# Patient Record
Sex: Male | Born: 1935 | Race: Black or African American | Hispanic: No | Marital: Married | Smoking: Former smoker
Health system: Southern US, Community
[De-identification: ages and names within clinical notes are randomized; demographics above are authoritative.]

## PROBLEM LIST (undated history)

## (undated) DIAGNOSIS — I509 Heart failure, unspecified: Secondary | ICD-10-CM

## (undated) DIAGNOSIS — I10 Essential (primary) hypertension: Secondary | ICD-10-CM

## (undated) HISTORY — DX: Essential (primary) hypertension: I10

---

## 2007-03-30 ENCOUNTER — Ambulatory Visit: Payer: Self-pay | Admitting: Internal Medicine

## 2007-07-31 ENCOUNTER — Ambulatory Visit: Payer: Self-pay | Admitting: Gastroenterology

## 2008-10-28 ENCOUNTER — Ambulatory Visit: Payer: Self-pay | Admitting: Gastroenterology

## 2009-01-12 ENCOUNTER — Ambulatory Visit: Payer: Self-pay | Admitting: Gastroenterology

## 2011-11-13 ENCOUNTER — Ambulatory Visit: Payer: Self-pay | Admitting: Internal Medicine

## 2017-09-17 ENCOUNTER — Observation Stay
Admit: 2017-09-17 | Discharge: 2017-09-17 | Disposition: A | Discharge status: Home / Self Care | Payer: Medicare Other | Attending: Internal Medicine | Admitting: Internal Medicine

## 2017-09-17 ENCOUNTER — Encounter: Payer: Self-pay | Admitting: Emergency Medicine

## 2017-09-17 ENCOUNTER — Emergency Department: Payer: Medicare Other

## 2017-09-17 ENCOUNTER — Other Ambulatory Visit: Payer: Self-pay

## 2017-09-17 ENCOUNTER — Inpatient Hospital Stay
Admission: EM | Admit: 2017-09-17 | Discharge: 2017-09-18 | DRG: 292 | Disposition: A | Discharge status: Home / Self Care | Payer: Medicare Other | Attending: Internal Medicine | Admitting: Internal Medicine

## 2017-09-17 DIAGNOSIS — R0602 Shortness of breath: Secondary | ICD-10-CM | POA: Diagnosis not present

## 2017-09-17 DIAGNOSIS — Z87891 Personal history of nicotine dependence: Secondary | ICD-10-CM

## 2017-09-17 DIAGNOSIS — R05 Cough: Secondary | ICD-10-CM

## 2017-09-17 DIAGNOSIS — I340 Nonrheumatic mitral (valve) insufficiency: Secondary | ICD-10-CM | POA: Diagnosis present

## 2017-09-17 DIAGNOSIS — J449 Chronic obstructive pulmonary disease, unspecified: Secondary | ICD-10-CM | POA: Diagnosis present

## 2017-09-17 DIAGNOSIS — I5023 Acute on chronic systolic (congestive) heart failure: Principal | ICD-10-CM | POA: Diagnosis present

## 2017-09-17 DIAGNOSIS — I472 Ventricular tachycardia: Secondary | ICD-10-CM | POA: Diagnosis present

## 2017-09-17 DIAGNOSIS — R059 Cough, unspecified: Secondary | ICD-10-CM

## 2017-09-17 DIAGNOSIS — J189 Pneumonia, unspecified organism: Secondary | ICD-10-CM

## 2017-09-17 DIAGNOSIS — I509 Heart failure, unspecified: Secondary | ICD-10-CM

## 2017-09-17 DIAGNOSIS — Z7982 Long term (current) use of aspirin: Secondary | ICD-10-CM

## 2017-09-17 DIAGNOSIS — Z79899 Other long term (current) drug therapy: Secondary | ICD-10-CM

## 2017-09-17 HISTORY — DX: Heart failure, unspecified: I50.9

## 2017-09-17 LAB — ECHOCARDIOGRAM COMPLETE
Height: 74 in
Weight: 2320 oz

## 2017-09-17 LAB — BRAIN NATRIURETIC PEPTIDE: B Natriuretic Peptide: 1513 pg/mL — ABNORMAL HIGH (ref 0.0–100.0)

## 2017-09-17 LAB — COMPREHENSIVE METABOLIC PANEL
ALT: 15 U/L (ref 0–44)
ANION GAP: 7 (ref 5–15)
AST: 27 U/L (ref 15–41)
Albumin: 3.2 g/dL — ABNORMAL LOW (ref 3.5–5.0)
Alkaline Phosphatase: 83 U/L (ref 38–126)
BUN: 14 mg/dL (ref 8–23)
CO2: 23 mmol/L (ref 22–32)
Calcium: 8.2 mg/dL — ABNORMAL LOW (ref 8.9–10.3)
Chloride: 110 mmol/L (ref 98–111)
Creatinine, Ser: 1.01 mg/dL (ref 0.61–1.24)
Glucose, Bld: 114 mg/dL — ABNORMAL HIGH (ref 70–99)
POTASSIUM: 4.2 mmol/L (ref 3.5–5.1)
Sodium: 140 mmol/L (ref 135–145)
TOTAL PROTEIN: 6.8 g/dL (ref 6.5–8.1)
Total Bilirubin: 1.1 mg/dL (ref 0.3–1.2)

## 2017-09-17 LAB — TROPONIN I
TROPONIN I: 0.06 ng/mL — AB (ref <0.03)
TROPONIN I: 0.07 ng/mL — AB (ref <0.03)
TROPONIN I: 0.07 ng/mL — AB (ref <0.03)
TROPONIN I: 0.08 ng/mL — AB (ref <0.03)

## 2017-09-17 LAB — CBC WITH DIFFERENTIAL/PLATELET
BASOS PCT: 1 %
Basophils Absolute: 0 10*3/uL (ref 0–0.1)
Eosinophils Absolute: 0.2 10*3/uL (ref 0–0.7)
Eosinophils Relative: 5 %
HCT: 36.5 % — ABNORMAL LOW (ref 40.0–52.0)
Hemoglobin: 12 g/dL — ABNORMAL LOW (ref 13.0–18.0)
LYMPHS PCT: 34 %
Lymphs Abs: 1.2 10*3/uL (ref 1.0–3.6)
MCH: 31 pg (ref 26.0–34.0)
MCHC: 33 g/dL (ref 32.0–36.0)
MCV: 94.2 fL (ref 80.0–100.0)
MONO ABS: 0.4 10*3/uL (ref 0.2–1.0)
MONOS PCT: 11 %
Neutro Abs: 1.7 10*3/uL (ref 1.4–6.5)
Neutrophils Relative %: 49 %
Platelets: 172 10*3/uL (ref 150–440)
RBC: 3.88 MIL/uL — ABNORMAL LOW (ref 4.40–5.90)
RDW: 13.2 % (ref 11.5–14.5)
WBC: 3.5 10*3/uL — ABNORMAL LOW (ref 3.8–10.6)

## 2017-09-17 LAB — PROCALCITONIN

## 2017-09-17 MED ORDER — ENOXAPARIN SODIUM 40 MG/0.4ML ~~LOC~~ SOLN
40.0000 mg | SUBCUTANEOUS | Status: DC
  Administered 2017-09-17: 40 mg via SUBCUTANEOUS
  Filled 2017-09-17: qty 0.4

## 2017-09-17 MED ORDER — HYDROXYZINE HCL 10 MG PO TABS
10.0000 mg | ORAL_TABLET | Freq: Every day | ORAL | Status: DC
  Administered 2017-09-17: 10 mg via ORAL
  Filled 2017-09-17 (×2): qty 1

## 2017-09-17 MED ORDER — ACETAMINOPHEN 325 MG PO TABS: 650.0000 mg | ORAL_TABLET | Freq: Four times a day (QID) | ORAL | Status: DC | PRN

## 2017-09-17 MED ORDER — ONDANSETRON HCL 4 MG PO TABS: 4.0000 mg | ORAL_TABLET | Freq: Four times a day (QID) | ORAL | Status: DC | PRN

## 2017-09-17 MED ORDER — FUROSEMIDE 10 MG/ML IJ SOLN: 20.0000 mg | Freq: Two times a day (BID) | INTRAMUSCULAR | Status: DC

## 2017-09-17 MED ORDER — SACUBITRIL-VALSARTAN 24-26 MG PO TABS
1.0000 | ORAL_TABLET | Freq: Two times a day (BID) | ORAL | Status: DC
  Administered 2017-09-18: 1 via ORAL
  Filled 2017-09-17 (×3): qty 1

## 2017-09-17 MED ORDER — ALBUTEROL SULFATE (2.5 MG/3ML) 0.083% IN NEBU
2.5000 mg | INHALATION_SOLUTION | RESPIRATORY_TRACT | Status: DC | PRN
  Administered 2017-09-17: 2.5 mg via RESPIRATORY_TRACT
  Filled 2017-09-17: qty 3

## 2017-09-17 MED ORDER — AZITHROMYCIN 500 MG PO TABS
500.0000 mg | ORAL_TABLET | Freq: Once | ORAL | Status: AC
  Administered 2017-09-17: 500 mg via ORAL
  Filled 2017-09-17: qty 1

## 2017-09-17 MED ORDER — SPIRONOLACTONE 25 MG PO TABS
12.5000 mg | ORAL_TABLET | Freq: Every day | ORAL | Status: DC
  Administered 2017-09-17: 12.5 mg via ORAL
  Filled 2017-09-17: qty 1
  Filled 2017-09-17 (×2): qty 0.5

## 2017-09-17 MED ORDER — CARVEDILOL 3.125 MG PO TABS
3.1250 mg | ORAL_TABLET | Freq: Two times a day (BID) | ORAL | Status: DC
  Administered 2017-09-17 – 2017-09-18 (×3): 3.125 mg via ORAL
  Filled 2017-09-17 (×3): qty 1

## 2017-09-17 MED ORDER — LEVOFLOXACIN 500 MG PO TABS: 500.0000 mg | ORAL_TABLET | Freq: Every day | ORAL | Status: DC

## 2017-09-17 MED ORDER — ACETAMINOPHEN 650 MG RE SUPP: 650.0000 mg | Freq: Four times a day (QID) | RECTAL | Status: DC | PRN

## 2017-09-17 MED ORDER — GUAIFENESIN-DM 100-10 MG/5ML PO SYRP
5.0000 mL | ORAL_SOLUTION | ORAL | Status: DC | PRN
  Administered 2017-09-17 – 2017-09-18 (×2): 5 mL via ORAL
  Filled 2017-09-17 (×2): qty 5

## 2017-09-17 MED ORDER — SODIUM CHLORIDE 0.9 % IV SOLN: INTRAVENOUS | Status: DC

## 2017-09-17 MED ORDER — FUROSEMIDE 10 MG/ML IJ SOLN
20.0000 mg | Freq: Two times a day (BID) | INTRAMUSCULAR | Status: DC
  Administered 2017-09-17: 20 mg via INTRAVENOUS
  Filled 2017-09-17 (×2): qty 4

## 2017-09-17 MED ORDER — SODIUM CHLORIDE 0.9 % IV SOLN
1.0000 g | Freq: Once | INTRAVENOUS | Status: AC
  Administered 2017-09-17: 1 g via INTRAVENOUS
  Filled 2017-09-17: qty 10

## 2017-09-17 MED ORDER — ASPIRIN EC 81 MG PO TBEC
81.0000 mg | DELAYED_RELEASE_TABLET | ORAL | Status: DC
  Administered 2017-09-18: 81 mg via ORAL
  Filled 2017-09-17: qty 1

## 2017-09-17 MED ORDER — ONDANSETRON HCL 4 MG/2ML IJ SOLN: 4.0000 mg | Freq: Four times a day (QID) | INTRAMUSCULAR | Status: DC | PRN

## 2017-09-17 NOTE — Progress Notes (Signed)
Spoke with Dr. Lowella FairyMiaer blood pressure is 115/82 and has 2 blood pressure medications order. Per md pt may have the coreg tonight [facility]

## 2017-09-17 NOTE — ED Notes (Signed)
Pt wife inquiring about pt taking home meds while here. This RN will notified EDP of request.

## 2017-09-17 NOTE — ED Notes (Signed)
.   Pt is resting, Respirations even and unlabored, NAD. Stretcher lowest postion and locked. Call bell within reach. Denies any needs at this time RN will continue to monitor.   Family at bedside.warm blanket given at this time.

## 2017-09-17 NOTE — Consult Note (Signed)
Erik Randall is a 84 y.o. male  [id number]  Primary Cardiologist: Arnoldo Hooker Reason for Consultation: CHF, severe MR and LV dysfunction.  HPI: 84 YOBM came to ER with SOB for few days which he thaught was due to cold. He denies any chest pain.   Review of Systems:  Has orthopnea/PND   Past Medical History:  Diagnosis Date  . CHF (congestive heart failure) (HCC)     Medications Prior to Admission  Medication Sig Dispense Refill  . aspirin EC 81 MG tablet Take 81 mg by mouth 2 (two) times a week.     . carvedilol (COREG) 3.125 MG tablet Take 3.125 mg by mouth 2 (two) times daily.  11  . furosemide (LASIX) 20 MG tablet Take 10 mg by mouth daily.  3  . hydrOXYzine (ATARAX/VISTARIL) 10 MG tablet Take 10 mg by mouth at bedtime.  0  . spironolactone (ALDACTONE) 25 MG tablet Take 12.5 mg by mouth daily.   1  . triamcinolone ointment (KENALOG) 0.1 % Apply 1 application topically at bedtime. (avoid face, groin and armpits)  0     . [START ON 09/18/2017] aspirin EC  81 mg Oral Once per day on Mon Thu  . carvedilol  3.125 mg Oral BID  . enoxaparin (LOVENOX) injection  40 mg Subcutaneous Q24H  . furosemide  20 mg Intravenous Q12H  . hydrOXYzine  10 mg Oral QHS  . sacubitril-valsartan  1 tablet Oral BID  . spironolactone  12.5 mg Oral Daily    Infusions:   No Known Allergies  Social History   Socioeconomic History  . Marital status: Married    Spouse name: Not on file  . Number of children: Not on file  . Years of education: Not on file  . Highest education level: Not on file  Occupational History  . Not on file  Social Needs  . Financial resource strain: Not on file  . Food insecurity:    Worry: Not on file    Inability: Not on file  . Transportation needs:    Medical: Not on file    Non-medical: Not on file  Tobacco Use  . Smoking status: Former Games developer  . Smokeless tobacco: Never Used  Substance and Sexual Activity  . Alcohol use: Never    Frequency: Never   . Drug use: Never  . Sexual activity: Not on file  Lifestyle  . Physical activity:    Days per week: Not on file    Minutes per session: Not on file  . Stress: Not on file  Relationships  . Social connections:    Talks on phone: Not on file    Gets together: Not on file    Attends religious service: Not on file    Active member of club or organization: Not on file    Attends meetings of clubs or organizations: Not on file    Relationship status: Not on file  . Intimate partner violence:    Fear of current or ex partner: Not on file    Emotionally abused: Not on file    Physically abused: Not on file    Forced sexual activity: Not on file  Other Topics Concern  . Not on file  Social History Narrative  . Not on file    History reviewed. No pertinent family history.  PHYSICAL EXAM: Vitals:   09/17/17 0735 09/17/17 1130  BP:  124/86  Pulse:  78  Resp:  18  Temp:  97.8  F (36.6 C)  SpO2: 95% 95%     Intake/Output Summary (Last 24 hours) at 09/17/2017 1445 Last data filed at 09/17/2017 1358 Gross per 24 hour  Intake 240 ml  Output -  Net 240 ml    General:  Well appearing. No respiratory difficulty HEENT: normal Neck: supple. no JVD. Carotids 2+ bilat; no bruits. No lymphadenopathy or thryomegaly appreciated. Cor: PMI nondisplaced. Regular rate & rhythm. No rubs, gallops or murmurs. Lungs: clear Abdomen: soft, nontender, nondistended. No hepatosplenomegaly. No bruits or masses. Good bowel sounds. Extremities: no cyanosis, clubbing, rash, edema Neuro: alert & oriented x 3, cranial nerves grossly intact. moves all 4 extremities w/o difficulty. Affect pleasant.  ECG: NSR frequent PVC, LAE, LVH, non specific st and t changes and Old ASWMI  Results for orders placed or performed during the hospital encounter of 09/17/17 (from the past 24 hour(s))  CBC with Differential     Status: Abnormal   Collection Time: 09/17/17  6:18 AM  Result Value Ref Range   WBC 3.5 (L) 3.8  - 10.6 K/uL   RBC 3.88 (L) 4.40 - 5.90 MIL/uL   Hemoglobin 12.0 (L) 13.0 - 18.0 g/dL   HCT 82.936.5 (L) 56.240.0 - 13.052.0 %   MCV 94.2 80.0 - 100.0 fL   MCH 31.0 26.0 - 34.0 pg   MCHC 33.0 32.0 - 36.0 g/dL   RDW 86.513.2 78.411.5 - 69.614.5 %   Platelets 172 150 - 440 K/uL   Neutrophils Relative % 49 %   Neutro Abs 1.7 1.4 - 6.5 K/uL   Lymphocytes Relative 34 %   Lymphs Abs 1.2 1.0 - 3.6 K/uL   Monocytes Relative 11 %   Monocytes Absolute 0.4 0.2 - 1.0 K/uL   Eosinophils Relative 5 %   Eosinophils Absolute 0.2 0 - 0.7 K/uL   Basophils Relative 1 %   Basophils Absolute 0.0 0 - 0.1 K/uL  Comprehensive metabolic panel     Status: Abnormal   Collection Time: 09/17/17  6:18 AM  Result Value Ref Range   Sodium 140 135 - 145 mmol/L   Potassium 4.2 3.5 - 5.1 mmol/L   Chloride 110 98 - 111 mmol/L   CO2 23 22 - 32 mmol/L   Glucose, Bld 114 (H) 70 - 99 mg/dL   BUN 14 8 - 23 mg/dL   Creatinine, Ser 2.951.01 0.61 - 1.24 mg/dL   Calcium 8.2 (L) 8.9 - 10.3 mg/dL   Total Protein 6.8 6.5 - 8.1 g/dL   Albumin 3.2 (L) 3.5 - 5.0 g/dL   AST 27 15 - 41 U/L   ALT 15 0 - 44 U/L   Alkaline Phosphatase 83 38 - 126 U/L   Total Bilirubin 1.1 0.3 - 1.2 mg/dL   GFR calc non Af Amer >60 >60 mL/min   GFR calc Af Amer >60 >60 mL/min   Anion gap 7 5 - 15  Troponin I     Status: Abnormal   Collection Time: 09/17/17  6:18 AM  Result Value Ref Range   Troponin I 0.06 (HH) <0.03 ng/mL  Brain natriuretic peptide     Status: Abnormal   Collection Time: 09/17/17  6:18 AM  Result Value Ref Range   B Natriuretic Peptide 1,513.0 (H) 0.0 - 100.0 pg/mL  Procalcitonin - Baseline     Status: None   Collection Time: 09/17/17 11:54 AM  Result Value Ref Range   Procalcitonin <0.10 ng/mL  Troponin I     Status: Abnormal   Collection  Time: 09/17/17 11:54 AM  Result Value Ref Range   Troponin I 0.07 (HH) <0.03 ng/mL   Dg Chest 2 View  Result Date: 09/17/2017 CLINICAL DATA:  Dyspnea EXAM: CHEST - 2 VIEW COMPARISON:  11/13/2011 chest  radiograph. FINDINGS: Stable cardiomediastinal silhouette with mild cardiomegaly. No pneumothorax. Trace bilateral pleural effusions. Emphysema with hyperinflated lungs. Mild diffuse prominence of the parahilar interstitial markings. Mild hazy opacities at the lung bases, slightly increased. IMPRESSION: 1. Trace bilateral pleural effusions. 2. Stable mild cardiomegaly with mild diffuse prominence of the parahilar interstitial markings, cannot exclude mild pulmonary edema. 3. Mild hazy opacities at the lung bases, slightly increased, which could represent aspiration or pneumonia. Electronically Signed   By: Delbert Phenix M.D.   On: 09/17/2017 07:04     ASSESSMENT AND PLAN: Severe Mitral regurgitation with severe LV systolic dysfunction with LVEF 10-12 %, and congestive heart failure. Advise Right and left heart cath and evaluation at [facility] for MVR. He wants to be treated medically and will add entaresto.  Donovyn Guidice A

## 2017-09-17 NOTE — Care Management Note (Signed)
Case Management Note  Patient Details  Name: Erik Randall MRN: [id number] Date of Birth: 08-03-1926  Subjective/Objective:  Patient admitted to [facility] under observation status for pneumonia. RNCM consulted on patient to provide MOON letter and complete assessment. Patient lives with his wife Erik Randall [phone]. Patient tells me he is completely independent with his activities if daily living. Requires no DME. Still drives. PCP is [facility]. No issues obtaining medications.                   Action/Plan:  RNCM to continue to follow for any needs.  Expected Discharge Date:                  Expected Discharge Plan:     In-House Referral:     Discharge planning Services     Post Acute Care Choice:    Choice offered to:     DME Arranged:    DME Agency:     HH Arranged:    HH Agency:     Status of Service:     If discussed at [employer] Meetings, dates discussed:    Additional Comments:  Erik ManifoldJosh A Azahel Belcastro, RN 09/17/2017, 2:51 PM

## 2017-09-17 NOTE — Progress Notes (Signed)
Patient refusing bed alarm. Patient educated on how to call for assistance if needed and when getting up. Patient verbalized understanding.

## 2017-09-17 NOTE — ED Notes (Signed)
.   Pt is resting, Respirations even and unlabored, NAD. Stretcher lowest postion and locked. Call bell within reach. Denies any needs at this time RN will continue to monitor.    

## 2017-09-17 NOTE — Progress Notes (Signed)
Patient admitted from ED. Wife at bedside. VSS. RT gave patient neb tx for SOB. No complaints at this time. Call bell in reach. Continue to monitor.

## 2017-09-17 NOTE — ED Notes (Signed)
Report to cassie, rn.  

## 2017-09-17 NOTE — ED Notes (Signed)
Patient transported to X-ray 

## 2017-09-17 NOTE — Progress Notes (Signed)
Pt with blood pressure of 97/76 and has Lasix ordered, also pt with troponin of 0.08. Spoke with Dr. Caryn BeeMaier may hold tonight dose of lasix. Md acknowledge troponin level.

## 2017-09-17 NOTE — Care Management Obs Status (Signed)
MEDICARE OBSERVATION STATUS NOTIFICATION   Patient Details  Name: Erik Randall MRN: [id number] Date of Birth: [birth year]   Medicare Observation Status Notification Given:  Yes    Janequa Kipnis A Sakai Wolford, RN 09/17/2017, 2:51 PM

## 2017-09-17 NOTE — ED Notes (Addendum)
..  CRITICAL VALUE STICKER  CRITICAL VALUE: Trop 0.06  RECEIVER (on-site recipient of call): Johnnette Barriosassie Malak Duchesneau RN   DATE & TIME NOTIFIED:  09/17/17 0710  MESSENGER (representative from lab): Marcelino DusterMichelle   MD NOTIFIED: Cyril LoosenKinner  TIME OF NOTIFICATION: [phone]

## 2017-09-17 NOTE — ED Notes (Signed)
Pt refused for blood stick at this time for blood cultures. This RN will notify Lab of the need to retry at a later time.

## 2017-09-17 NOTE — ED Provider Notes (Signed)
[facility] Emergency Department Provider Note   ____________________________________________   First MD Initiated Contact with Patient 09/17/17 [phone]     (approximate)  I have reviewed the triage vital signs and the nursing notes.   HISTORY  Chief Complaint Shortness of Breath    HPI Erik Randall is a 84 y.o. male who comes into the hospital today feeling like he has pneumonia.  The patient woke up this morning and states that he was sweating.  He was coughing but could not cough up what ever was in his chest.  He felt bad like he had pneumonia.  The patient states that he has been coughing all day.  He has had the symptoms actually for the past week.  He states though that it was bad today.  The patient denies any nausea or vomiting.  He reports that he has had these symptoms for about a week.  He had gone to church and when he came home the next day he was feeling unwell.  The patient states that he has been coughing all week but he cannot get anything up.  He denies headache fevers or blurred vision.  The patient states that he drank some liquor yesterday to try to get it to go away.  The patient is here today for evaluation.   Past Medical History:  Diagnosis Date  . CHF (congestive heart failure) (HCC)     There are no active problems to display for this patient.   History reviewed. No pertinent surgical history.  Prior to Admission medications   Not on File    Allergies Patient has no known allergies.  No family history on file.  Social History Social History   Tobacco Use  . Smoking status: Former Games developermoker  . Smokeless tobacco: Never Used  Substance Use Topics  . Alcohol use: Never    Frequency: Never  . Drug use: Never    Review of Systems  Constitutional: ill feeling Eyes: No visual changes. ENT: No sore throat. Cardiovascular: Denies chest pain. Respiratory: cough and SOB shortness of breath. Gastrointestinal: No abdominal  pain.  No nausea,  Genitourinary: Negative for dysuria. Musculoskeletal: Negative for back pain. Skin: Negative for rash. Neurological: Negative for headaches, focal weakness or numbness.   ____________________________________________   PHYSICAL EXAM:  VITAL SIGNS: ED Triage Vitals  Enc Vitals Group     BP 09/17/17 0614 104/82     Pulse Rate 09/17/17 0612 79     Resp 09/17/17 0610 (!) 24     Temp 09/17/17 0614 98.1 F (36.7 C)     Temp Source 09/17/17 0610 Oral     SpO2 09/17/17 0610 92 %     Weight 09/17/17 0611 145 lb (65.8 kg)     Height 09/17/17 0611 6\' 2"  (1.88 m)     Head Circumference --      Peak Flow --      Pain Score 09/17/17 0610 0     Pain Loc --      Pain Edu? --      Excl. in GC? --     Constitutional: Alert and oriented. Well appearing and in mild distress. Eyes: Conjunctivae are normal. PERRL. EOMI. Head: Atraumatic. Nose: No congestion/rhinnorhea. Mouth/Throat: Mucous membranes are moist.  Oropharynx non-erythematous. Neck: No stridor.   Cardiovascular: Normal rate, regular rhythm. Grossly normal heart sounds.  Good peripheral circulation. Respiratory: Normal respiratory effort.  No retractions. Lungs CTAB. Gastrointestinal: Soft and nontender. No distention. Positive bowel sounds  Musculoskeletal: No lower extremity tenderness nor edema.   Neurologic:  Normal speech and language.  Skin:  Skin is warm, dry and intact. Marland Kitchen Psychiatric: Mood and affect are normal.   ____________________________________________   LABS (all labs ordered are listed, but only abnormal results are displayed)  Labs Reviewed  CBC WITH DIFFERENTIAL/PLATELET - Abnormal; Notable for the following components:      Result Value   WBC 3.5 (*)    RBC 3.88 (*)    Hemoglobin 12.0 (*)    HCT 36.5 (*)    All other components within normal limits  COMPREHENSIVE METABOLIC PANEL - Abnormal; Notable for the following components:   Glucose, Bld 114 (*)    Calcium 8.2 (*)    Albumin  3.2 (*)    All other components within normal limits  TROPONIN I - Abnormal; Notable for the following components:   Troponin I 0.06 (*)    All other components within normal limits   ____________________________________________  EKG  ED ECG REPORT I, Rebecka Apley, the attending physician, personally viewed and interpreted this ECG.   Date: 09/17/2017  EKG Time: 612  Rate: 82  Rhythm: normal sinus rhythm, frequent PCVs  Axis: normal  Intervals:none  ST&T Change: Flipped T waves in leads 6  ____________________________________________  RADIOLOGY  ED MD interpretation:  CXR: Trace bilateral pleural effusions, stable cardiomegaly with mild diffuse prominence of the parahilar interstitial markings, cannot exclude mild pulmonary edema, mild hazy opacities at the lung bases slight increased which could represent aspiration or pneumonia.  Official radiology report(s): Dg Chest 2 View  Result Date: 09/17/2017 CLINICAL DATA:  Dyspnea EXAM: CHEST - 2 VIEW COMPARISON:  11/13/2011 chest radiograph. FINDINGS: Stable cardiomediastinal silhouette with mild cardiomegaly. No pneumothorax. Trace bilateral pleural effusions. Emphysema with hyperinflated lungs. Mild diffuse prominence of the parahilar interstitial markings. Mild hazy opacities at the lung bases, slightly increased. IMPRESSION: 1. Trace bilateral pleural effusions. 2. Stable mild cardiomegaly with mild diffuse prominence of the parahilar interstitial markings, cannot exclude mild pulmonary edema. 3. Mild hazy opacities at the lung bases, slightly increased, which could represent aspiration or pneumonia. Electronically Signed   By: Delbert Phenix M.D.   On: 09/17/2017 07:04    ____________________________________________   PROCEDURES  Procedure(s) performed: None  Procedures  Critical Care performed: No  ____________________________________________   INITIAL IMPRESSION / ASSESSMENT AND PLAN / ED COURSE  As part of my  medical decision making, I reviewed the following data within the electronic MEDICAL RECORD NUMBER Notes from prior ED visits and [place] Controlled Substance Database   This is an 84 year old male who comes into the hospital today with some cough and some shortness of breath.  The patient feels like he may have pneumonia.  We did check some blood work on the patient and the patient's blood work is unremarkable the patient had a chest x-ray which showed some trace pleural effusions as well as some mild cardiomegaly and some interstitial markings with some hazy opacities in the bases concerned about a pneumonia.  The patient's troponin came back at 0.06 and the remaining blood work is unremarkable.  The patient's care will be signed out to Dr. Cyril Loosen who will repeat the patient's troponin and disposition the patient.       ____________________________________________   FINAL CLINICAL IMPRESSION(S) / ED DIAGNOSES  Final diagnoses:  Community acquired pneumonia, unspecified laterality  Cough     ED Discharge Orders    None       Note:  This document was prepared using Dragon voice recognition software and may include unintentional dictation errors.    Rebecka Apley, MD 09/17/17 [phone]

## 2017-09-17 NOTE — Progress Notes (Signed)
*  PRELIMINARY RESULTS* Echocardiogram 2D Echocardiogram has been performed.  Erik Randall 09/17/2017, 1:07 PM

## 2017-09-17 NOTE — ED Triage Notes (Signed)
Pt arrives with ems from home with shob. Pt states he woke up "sweating and I couldn't breathe". 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 Pt arrives on oxygen 4lpm via [place] and states oxygen is improving shob. Pt with strong cough.

## 2017-09-17 NOTE — H&P (Signed)
[facility] [place] at [facility]   PATIENT NAME: Erik Randall Haydel    MR#:  [id number]  DATE OF BIRTH:  1927-03-14  DATE OF ADMISSION:  09/17/2017  PRIMARY CARE PHYSICIAN: Dorothey BasemanBronstein, David, MD   REQUESTING/REFERRING PHYSICIAN: dr Cyril Loosenkinner  CHIEF COMPLAINT:  increasing shortness of breath and cough with sputum  HISTORY OF PRESENT ILLNESS:  Erik Randall Goertzen  is a 84 y.o. male with a known history of COPD, congestive heart failure systolic chronic comes to the emergency room increasing shortness of breath and cough with clear phlegm. No fever. he was short winded. Sats documented in the ER more than 92% on room air. Patient has chest x-ray consistent with interstitial edema more likely CHF. BNP elevated 1500. Patient is being admitted with acute on chronic congestive heart failure systolic.  Echo In 2015 showed EF of 15%.  PAST MEDICAL HISTORY:   Past Medical History:  Diagnosis Date  . CHF (congestive heart failure) (HCC)     PAST SURGICAL HISTOIRY:  History reviewed. No pertinent surgical history.  SOCIAL HISTORY:   Social History   Tobacco Use  . Smoking status: Former Games developermoker  . Smokeless tobacco: Never Used  Substance Use Topics  . Alcohol use: Never    Frequency: Never    FAMILY HISTORY:  No family history on file.  DRUG ALLERGIES:  No Known Allergies  REVIEW OF SYSTEMS:  Review of Systems  Constitutional: Negative for chills, fever and weight loss.  HENT: Negative for ear discharge, ear pain and nosebleeds.   Eyes: Negative for blurred vision, pain and discharge.  Respiratory: Positive for cough and shortness of breath. Negative for sputum production, wheezing and stridor.   Cardiovascular: Negative for chest pain, palpitations, orthopnea and PND.  Gastrointestinal: Negative for abdominal pain, diarrhea, nausea and vomiting.  Genitourinary: Negative for frequency and urgency.  Musculoskeletal: Negative for back pain and joint pain.   Neurological: Negative for sensory change, speech change, focal weakness and weakness.  Psychiatric/Behavioral: Negative for depression and hallucinations. The patient is not nervous/anxious.      MEDICATIONS AT HOME:   Prior to Admission medications   Medication Sig Start Date End Date Taking? Authorizing Provider  aspirin EC 81 MG tablet Take 81 mg by mouth 2 (two) times a week.    Yes [provider]  carvedilol (COREG) 3.125 MG tablet Take 3.125 mg by mouth 2 (two) times daily. 08/22/17  Yes [provider]  furosemide (LASIX) 20 MG tablet Take 10 mg by mouth daily. 06/14/17  Yes [provider]  hydrOXYzine (ATARAX/VISTARIL) 10 MG tablet Take 10 mg by mouth at bedtime. 09/04/17  Yes [provider]  spironolactone (ALDACTONE) 25 MG tablet Take 12.5 mg by mouth daily.  07/06/17  Yes [provider]  triamcinolone ointment (KENALOG) 0.1 % Apply 1 application topically at bedtime. (avoid face, groin and armpits) 09/04/17  Yes [provider]      VITAL SIGNS:  Blood pressure 112/72, pulse (!) 33, temperature 98.1 F (36.7 C), resp. rate (!) 21, height 6\' 2"  (1.88 m), weight 65.8 kg (145 lb), SpO2 95 %.  PHYSICAL EXAMINATION:  GENERAL:  84 y.o.-year-old patient lying in the bed with no acute distress.  EYES: Pupils equal, round, reactive to light and accommodation. No scleral icterus. Extraocular muscles intact.  HEENT: Head atraumatic, normocephalic. Oropharynx and nasopharynx clear.  NECK:  Supple, no jugular venous distention. No thyroid enlargement, no tenderness.  LUNGS decreased breath sounds bilaterally, no wheezing, rales,rhonchi or  crepitation. No use of accessory muscles of respiration.  CARDIOVASCULAR: S1, S2 normal. No murmurs, rubs, or gallops.  ABDOMEN: Soft, nontender, nondistended. Bowel sounds present. No organomegaly or mass.  EXTREMITIES: No pedal edema, cyanosis, or clubbing.  NEUROLOGIC: Cranial nerves II through  XII are intact. Muscle strength 5/5 in all extremities. Sensation intact. Gait not checked.  PSYCHIATRIC: The patient is alert and oriented x 3.  SKIN: No obvious rash, lesion, or ulcer.   LABORATORY PANEL:   CBC Recent Labs  Lab 09/17/17 0618  WBC 3.5*  HGB 12.0*  HCT 36.5*  PLT 172   ------------------------------------------------------------------------------------------------------------------  Chemistries  Recent Labs  Lab 09/17/17 0618  NA 140  K 4.2  CL 110  CO2 23  GLUCOSE 114*  BUN 14  CREATININE 1.01  CALCIUM 8.2*  AST 27  ALT 15  ALKPHOS 83  BILITOT 1.1   ------------------------------------------------------------------------------------------------------------------  Cardiac Enzymes Recent Labs  Lab 09/17/17 0618  TROPONINI 0.06*   ------------------------------------------------------------------------------------------------------------------  RADIOLOGY:  Dg Chest 2 View  Result Date: 09/17/2017 CLINICAL DATA:  Dyspnea EXAM: CHEST - 2 VIEW COMPARISON:  11/13/2011 chest radiograph. FINDINGS: Stable cardiomediastinal silhouette with mild cardiomegaly. No pneumothorax. Trace bilateral pleural effusions. Emphysema with hyperinflated lungs. Mild diffuse prominence of the parahilar interstitial markings. Mild hazy opacities at the lung bases, slightly increased. IMPRESSION: 1. Trace bilateral pleural effusions. 2. Stable mild cardiomegaly with mild diffuse prominence of the parahilar interstitial markings, cannot exclude mild pulmonary edema. 3. Mild hazy opacities at the lung bases, slightly increased, which could represent aspiration or pneumonia. Electronically Signed   By: Delbert Phenix M.D.   On: 09/17/2017 07:04    EKG:    IMPRESSION AND PLAN:   Erik Randall  is a 84 y.o. male with a known history of COPD, congestive heart failure systolic chronic comes to the emergency room increasing shortness of breath and cough with clear phlegm. No fever. he  was short winded. Sats documented in the ER more than 92% on room air. Patient has chest x-ray consistent with interstitial edema more likely CHF. BNP elevated 1500.  1. acute on chronic systolic congestive heart failure -admit to medical floor -telemonitoring. -BNP elevated to 1500. Patient presented with increasing shortness of breath and chest x-ray consistent with edema. -IV Lasix 20 BID monitor input output -continue Coreg, spironolactone -repeat echo -I will discontinue antibiotic. White count normal no fever. -Will check Pro calcitonin  2. COPD -patient quit smoking many years ago -no wheezing. -PRN cough medicine. PRN nebulizer  3. DVT prophylaxis subcu Lovenox  Discussed with patient. Agreeable with plan.   All the records are reviewed and case discussed with ED provider. Management plans discussed with the patient, family and they are in agreement.  CODE STATUS: FULL  TOTAL TIME TAKING CARE OF THIS PATIENT: 50 minutes.    Enedina Finner M.D on 09/17/2017 at 11:25 AM  Between 7am to 6pm - Pager - [phone]  After 6pm go to [email or web address] - password [email or web address] [facility]  [facility]  Office  [phone]  CC: Primary care physician; Dorothey Baseman, MD

## 2017-09-17 NOTE — ED Notes (Signed)
Pt had a 30 beats of Vtach. Pt was asymptomatic at this time. Strip printed and given to EDP [employer] MutualKinner

## 2017-09-17 NOTE — ED Notes (Signed)
.   Pt is resting, Respirations even and unlabored, NAD. Stretcher lowest postion and locked. Call bell within reach. Denies any needs at this time RN will continue to monitor.    Family at bedside awaiting admission

## 2017-09-17 NOTE — Progress Notes (Signed)
Family Meeting Note  Advance Directive:no  Today a meeting took place with the pt in the room  Patient came in with increasing shortness of breath and coughing up clear phlegm. He was found to be in congestive heart failure acute on chronic systolic. He has EF of 15% from previous echo. Long-term prognosis poor. Discuss code status. Patient wishes to be full code.  Time spent during discussion:17 mins  Enedina FinnerSona Gayanne Prescott, MD

## 2017-09-18 DIAGNOSIS — I5023 Acute on chronic systolic (congestive) heart failure: Secondary | ICD-10-CM | POA: Diagnosis present

## 2017-09-18 DIAGNOSIS — Z87891 Personal history of nicotine dependence: Secondary | ICD-10-CM | POA: Diagnosis not present

## 2017-09-18 DIAGNOSIS — I509 Heart failure, unspecified: Secondary | ICD-10-CM

## 2017-09-18 DIAGNOSIS — R0602 Shortness of breath: Secondary | ICD-10-CM | POA: Diagnosis present

## 2017-09-18 DIAGNOSIS — Z79899 Other long term (current) drug therapy: Secondary | ICD-10-CM | POA: Diagnosis not present

## 2017-09-18 DIAGNOSIS — I340 Nonrheumatic mitral (valve) insufficiency: Secondary | ICD-10-CM | POA: Diagnosis present

## 2017-09-18 DIAGNOSIS — I472 Ventricular tachycardia: Secondary | ICD-10-CM | POA: Diagnosis present

## 2017-09-18 DIAGNOSIS — Z7982 Long term (current) use of aspirin: Secondary | ICD-10-CM | POA: Diagnosis not present

## 2017-09-18 DIAGNOSIS — J449 Chronic obstructive pulmonary disease, unspecified: Secondary | ICD-10-CM | POA: Diagnosis present

## 2017-09-18 LAB — MAGNESIUM: Magnesium: 1.6 mg/dL — ABNORMAL LOW (ref 1.7–2.4)

## 2017-09-18 MED ORDER — MAGNESIUM OXIDE 400 (241.3 MG) MG PO TABS: 400.0000 mg | ORAL_TABLET | Freq: Every day | ORAL | 0 refills | Status: DC

## 2017-09-18 MED ORDER — SACUBITRIL-VALSARTAN 24-26 MG PO TABS: 1.0000 | ORAL_TABLET | Freq: Two times a day (BID) | ORAL | 2 refills | Status: DC

## 2017-09-18 MED ORDER — FUROSEMIDE 20 MG PO TABS: 20.0000 mg | ORAL_TABLET | Freq: Every day | ORAL | 3 refills | Status: DC

## 2017-09-18 MED ORDER — MAGNESIUM OXIDE 400 (241.3 MG) MG PO TABS: 400.0000 mg | ORAL_TABLET | Freq: Every day | ORAL | Status: DC

## 2017-09-18 MED ORDER — MAGNESIUM SULFATE 4 GM/100ML IV SOLN
4.0000 g | Freq: Once | INTRAVENOUS | Status: AC
  Administered 2017-09-18: 4 g via INTRAVENOUS
  Filled 2017-09-18 (×2): qty 100

## 2017-09-18 NOTE — Progress Notes (Signed)
Pt remaining alert and oriented. Pt independent in room with wife. Medicated for cough with good results. No complaints of shortness of breath. Pt able to remain on room air.

## 2017-09-18 NOTE — Progress Notes (Signed)
[facility]  Patient: Erik LawsClyde Randall / Admit Date: 09/17/2017 / Date of Encounter: 09/18/2017, 6:38 AM   Subjective: Patient feeling much better today.  No evidence of congestive heart failure type symptoms after diuresis.  Patient ambulating relatively well Echocardiogram consistently showing severe LV systolic dysfunction and severe dilation and with ejection fraction of 15% over the last several years with severe mitral regurgitation. She has consistently not wanted further intervention for several years and on appropriate medication management Patient interviewed and appears that his main issue has been dietary indiscretion with significant salt intake  Review of Systems: Positive for: None Negative for: Vision change, hearing change, syncope, dizziness, nausea, vomiting,diarrhea, bloody stool, stomach pain, cough, congestion, diaphoresis, urinary frequency, urinary pain,skin lesions, skin rashes Others previously listed  Objective: Telemetry: Normal sinus rhythm Physical Exam: Blood pressure 97/76, pulse 68, temperature 98 F (36.7 C), resp. rate 18, height 6\' 2"  (1.88 m), weight 145 lb (65.8 kg), SpO2 94 %. Body mass index is 18.62 kg/m. General: Well developed, well nourished, in no acute distress. Head: Normocephalic, atraumatic, sclera non-icteric, no xanthomas, nares are without discharge. Neck: No apparent masses Lungs: Normal respirations with few wheezes, no rhonchi, no rales , no crackles   Heart: Regular rate and rhythm, normal S1 S2, 3+ apical murmur, no rub, no gallop, PMI is normal size and placement, carotid upstroke normal without bruit, jugular venous pressure normal Abdomen: Soft, non-tender, non-distended with normoactive bowel sounds. No hepatosplenomegaly. Abdominal aorta is normal size without bruit Extremities: Trace edema, no clubbing, no cyanosis, no ulcers,  Peripheral: 2+ radial, 2+ femoral, 2+ dorsal pedal pulses Neuro:  Alert and oriented. Moves all extremities spontaneously. Psych:  Responds to questions appropriately with a normal affect.   Intake/Output Summary (Last 24 hours) at 09/18/2017 0638 Last data filed at 09/17/2017 1358 Gross per 24 hour  Intake 240 ml  Output -  Net 240 ml    Inpatient Medications:  . aspirin EC  81 mg Oral Once per day on Mon Thu  . carvedilol  3.125 mg Oral BID  . enoxaparin (LOVENOX) injection  40 mg Subcutaneous Q24H  . furosemide  20 mg Intravenous Q12H  . hydrOXYzine  10 mg Oral QHS  . sacubitril-valsartan  1 tablet Oral BID  . spironolactone  12.5 mg Oral Daily   Infusions:   Labs: Recent Labs    09/17/17 0618  NA 140  K 4.2  CL 110  CO2 23  GLUCOSE 114*  BUN 14  CREATININE 1.01  CALCIUM 8.2*   Recent Labs    09/17/17 0618  AST 27  ALT 15  ALKPHOS 83  BILITOT 1.1  PROT 6.8  ALBUMIN 3.2*   Recent Labs    09/17/17 0618  WBC 3.5*  NEUTROABS 1.7  HGB 12.0*  HCT 36.5*  MCV 94.2  PLT 172   Recent Labs    09/17/17 0618 09/17/17 1154 09/17/17 1614 09/17/17 2033  TROPONINI 0.06* 0.07* 0.07* 0.08*   Invalid input(s): POCBNP No results for input(s): HGBA1C in the last 72 hours.   Weights: Filed Weights   09/17/17 [id number]  Weight: 145 lb (65.8 kg)     Radiology/Studies:  Dg Chest 2 View  Result Date: 09/17/2017 CLINICAL DATA:  Dyspnea EXAM: CHEST - 2 VIEW COMPARISON:  11/13/2011 chest radiograph. FINDINGS: Stable cardiomediastinal silhouette with mild cardiomegaly. No pneumothorax. Trace bilateral pleural effusions. Emphysema with hyperinflated lungs. Mild diffuse prominence of the parahilar interstitial markings. Mild hazy opacities at the lung  bases, slightly increased. IMPRESSION: 1. Trace bilateral pleural effusions. 2. Stable mild cardiomegaly with mild diffuse prominence of the parahilar interstitial markings, cannot exclude mild pulmonary edema. 3. Mild hazy opacities at the lung bases, slightly increased, which could represent  aspiration or pneumonia. Electronically Signed   By: Delbert Phenix M.D.   On: 09/17/2017 07:04     Assessment and Recommendation  84 y.o. male with known severe dilated cardiomyopathy with LV systolic dysfunction and ejection fraction of 15% with severe mitral regurgitation consistently wanting medical management for his issue status post acute on chronic systolic dysfunction heart failure and secondary to dietary indiscretion now improved.evidence of myocardial infarction 1.  Continue medication management with furosemide to oral medication management today 2.  Patient may need dietary evaluation to reduce sodium intake 3.  Continue medication management including carvedilol spironolactone and now Entresto for acute on chronic systolic dysfunction heart failure 4.  Ambulate and follow for improvements of symptoms and okay for discharged home today with follow-up next week as able  Signed, Arnoldo Hooker M.D. FACC

## 2017-09-18 NOTE — Progress Notes (Signed)
Prior to discharge RN re-enforced CHF education. Pt verbalized understanding. Care Plan was not opened. Pt encouraged to keep CHF clinic appt. I will continue to assess.

## 2017-09-18 NOTE — Progress Notes (Signed)
CCMD called this RN that patient had a 9 beat run of V-tech. VSS. Patient had no complaints. Notified MD. New orders for a Magnesium to add to blood from this am.

## 2017-09-18 NOTE — Discharge Summary (Addendum)
[facility] - [place] at [facility]   PATIENT NAME: Erik Randall    MR#:  [id number]  DATE OF BIRTH:  1926-07-04  DATE OF ADMISSION:  09/17/2017 ADMITTING PHYSICIAN: Enedina Finner, MD  DATE OF DISCHARGE: 09/18/2017  PRIMARY CARE PHYSICIAN: Dorothey Baseman, MD    ADMISSION DIAGNOSIS:  Cough [R05] Community acquired pneumonia, unspecified laterality [J18.9]  DISCHARGE DIAGNOSIS:  Acute on Chronic systolic CHF Severe CMP EF 15 % COPD  SECONDARY DIAGNOSIS:   Past Medical History:  Diagnosis Date  . CHF (congestive heart failure) [facility])     HOSPITAL COURSE:   Erik Ferrentino  is a 84 y.o. male with a known history of COPD, congestive heart failure systolic chronic comes to the emergency room increasing shortness of breath and cough with clear phlegm. No fever.he was short winded. Sats documented in the ER more than 92% on room air. Patient has chest x-ray consistent with interstitial edema more likely CHF. BNP elevated 1500.  1. acute on chronic systolic congestive heart failure -tele monitoring- showed some short runs of NSVT. Pt asymptomatic -BNP elevated to 1500. Patient presented with increasing shortness of breath and chest x-ray consistent with edema. -IV Lasix 20 BID monitor input output--no UOP documented Pt ambulated well. Feels well and able to complete sentences w/o difficulty -continue Coreg, spironolactone, added enteresto by Cardiology -repeat echo shows EF 10-12 % severe MR--pt follows dr Gwen Pounds -I will discontinue antibiotic. White count normal no fever.  2. COPD -patient quit smoking many years ago -no wheezing. -PRN cough medicine. PRN nebulizer  3. DVT prophylaxis subcu Lovenox  4. Hypomagnesemia -IV magnesium 4 g bolus now Magnesium 400 mg po daily  Pt will see Dr Gwen Pounds in a week sats >90% on RA on ambulation  D/w pt and wife   CONSULTS OBTAINED:    DRUG ALLERGIES:  No Known Allergies  DISCHARGE MEDICATIONS:    Allergies as of 09/18/2017   No Known Allergies     Medication List    TAKE these medications   aspirin EC 81 MG tablet Take 81 mg by mouth 2 (two) times a week.   carvedilol 3.125 MG tablet Commonly known as:  COREG Take 3.125 mg by mouth 2 (two) times daily.   furosemide 20 MG tablet Commonly known as:  LASIX Take 1 tablet (20 mg total) by mouth daily. What changed:  how much to take   hydrOXYzine 10 MG tablet Commonly known as:  ATARAX/VISTARIL Take 10 mg by mouth at bedtime.   magnesium oxide 400 (241.3 Mg) MG tablet Commonly known as:  MAG-OX Take 1 tablet (400 mg total) by mouth daily.   sacubitril-valsartan 24-26 MG Commonly known as:  ENTRESTO Take 1 tablet by mouth 2 (two) times daily.   spironolactone 25 MG tablet Commonly known as:  ALDACTONE Take 12.5 mg by mouth daily.   triamcinolone ointment 0.1 % Commonly known as:  KENALOG Apply 1 application topically at bedtime. (avoid face, groin and armpits)       If you experience worsening of your admission symptoms, develop shortness of breath, life threatening emergency, suicidal or homicidal thoughts you must seek medical attention immediately by calling 911 or calling your MD immediately  if symptoms less severe.  You Must read complete instructions/literature along with all the possible adverse reactions/side effects for all the Medicines you take and that have been prescribed to you. Take any new Medicines after you have completely understood and accept all the possible adverse reactions/side effects.  Please note  You were cared for by a hospitalist during your hospital stay. If you have any questions about your discharge medications or the care you received while you were in the hospital after you are discharged, you can call the unit and asked to speak with the hospitalist on call if the hospitalist that took care of you is not available. Once you are discharged, your primary care physician will  handle any further medical issues. Please note that NO REFILLS for any discharge medications will be authorized once you are discharged, as it is imperative that you return to your primary care physician (or establish a relationship with a primary care physician if you do not have one) for your aftercare needs so that they can reassess your need for medications and monitor your lab values. Today   SUBJECTIVE  Doing well. Sob improved. Pt said he urinated a lot   VITAL SIGNS:  Blood pressure 107/74, pulse 70, temperature 98.4 F (36.9 C), resp. rate 20, height 6\' 2"  (1.88 m), weight 65.8 kg (145 lb), SpO2 97 %.  I/O:    Intake/Output Summary (Last 24 hours) at 09/18/2017 1129 Last data filed at 09/18/2017 1007 Gross per 24 hour  Intake 480 ml  Output -  Net 480 ml    PHYSICAL EXAMINATION:  GENERAL:  84 y.o.-year-old patient lying in the bed with no acute distress.  EYES: Pupils equal, round, reactive to light and accommodation. No scleral icterus. Extraocular muscles intact.  HEENT: Head atraumatic, normocephalic. Oropharynx and nasopharynx clear.  NECK:  Supple, no jugular venous distention. No thyroid enlargement, no tenderness.  LUNGS: Normal breath sounds bilaterally, no wheezing, rales,rhonchi or crepitation. No use of accessory muscles of respiration.  CARDIOVASCULAR: S1, S2 normal. No murmurs, rubs, or gallops.  ABDOMEN: Soft, non-tender, non-distended. Bowel sounds present. No organomegaly or mass.  EXTREMITIES: No pedal edema, cyanosis, or clubbing.  NEUROLOGIC: Cranial nerves II through XII are intact. Muscle strength 5/5 in all extremities. Sensation intact. Gait not checked.  PSYCHIATRIC: The patient is alert and oriented x 3.  SKIN: No obvious rash, lesion, or ulcer.   DATA REVIEW:   CBC  Recent Labs  Lab 09/17/17 0618  WBC 3.5*  HGB 12.0*  HCT 36.5*  PLT 172    Chemistries  Recent Labs  Lab 09/17/17 0618 09/17/17 2033  NA 140  --   K 4.2  --   CL 110  --    CO2 23  --   GLUCOSE 114*  --   BUN 14  --   CREATININE 1.01  --   CALCIUM 8.2*  --   MG  --  1.6*  AST 27  --   ALT 15  --   ALKPHOS 83  --   BILITOT 1.1  --     Microbiology Results   Recent Results (from the past 240 hour(s))  Blood culture (routine x 2)     Status: None (Preliminary result)   Collection Time: 09/17/17 11:54 AM  Result Value Ref Range Status   Specimen Description BLOOD LEFT ANTECUBITAL  Final   Special Requests   Final    BOTTLES DRAWN AEROBIC AND ANAEROBIC Blood Culture adequate volume   Culture   Final    NO GROWTH < 24 HOURS Performed at [facility], [address] [place], [place] [address]    Report Status PENDING  Incomplete  Blood culture (routine x 2)     Status: None (Preliminary result)   Collection  Time: 09/17/17 12:00 PM  Result Value Ref Range Status   Specimen Description BLOOD RIGHT ANTECUBITAL  Final   Special Requests   Final    BOTTLES DRAWN AEROBIC AND ANAEROBIC Blood Culture adequate volume   Culture   Final    NO GROWTH < 24 HOURS Performed at [facility], [address] [place], [place] [address]    Report Status PENDING  Incomplete    RADIOLOGY:  Dg Chest 2 View  Result Date: 09/17/2017 CLINICAL DATA:  Dyspnea EXAM: CHEST - 2 VIEW COMPARISON:  11/13/2011 chest radiograph. FINDINGS: Stable cardiomediastinal silhouette with mild cardiomegaly. No pneumothorax. Trace bilateral pleural effusions. Emphysema with hyperinflated lungs. Mild diffuse prominence of the parahilar interstitial markings. Mild hazy opacities at the lung bases, slightly increased. IMPRESSION: 1. Trace bilateral pleural effusions. 2. Stable mild cardiomegaly with mild diffuse prominence of the parahilar interstitial markings, cannot exclude mild pulmonary edema. 3. Mild hazy opacities at the lung bases, slightly increased, which could represent aspiration or pneumonia. Electronically Signed   By: Delbert PhenixJason A Poff M.D.   On: 09/17/2017 07:04      Management plans discussed with the patient, family and they are in agreement.  CODE STATUS:     Code Status Orders  (From admission, onward)        Start     Ordered   09/17/17 1130  Full code  Continuous     09/17/17 1129    Code Status History    This patient has a current code status but no historical code status.      TOTAL TIME TAKING CARE OF THIS PATIENT: *40** minutes.    Enedina FinnerSona Billy Rocco M.D on 09/18/2017 at 11:29 AM  Between 7am to 6pm - Pager - [phone] After 6pm go to [email or web address] - Social research officer, [email or web address] [email or web address]  [facility]  Office  [phone]  CC: Primary care physician; Dorothey BasemanBronstein, David, MD

## 2017-09-22 LAB — CULTURE, BLOOD (ROUTINE X 2)
CULTURE: NO GROWTH
Culture: NO GROWTH
Special Requests: ADEQUATE
Special Requests: ADEQUATE

## 2017-10-06 ENCOUNTER — Ambulatory Visit: Payer: Medicare Other | Attending: Family | Admitting: Family

## 2017-10-06 ENCOUNTER — Encounter: Payer: Self-pay | Admitting: Family

## 2017-10-06 VITALS — BP 83/48 | HR 66 | Resp 18 | Ht 74.0 in | Wt 136.5 lb

## 2017-10-06 DIAGNOSIS — I509 Heart failure, unspecified: Secondary | ICD-10-CM | POA: Diagnosis not present

## 2017-10-06 DIAGNOSIS — R5383 Other fatigue: Secondary | ICD-10-CM | POA: Diagnosis present

## 2017-10-06 DIAGNOSIS — R42 Dizziness and giddiness: Secondary | ICD-10-CM | POA: Diagnosis present

## 2017-10-06 DIAGNOSIS — Z87891 Personal history of nicotine dependence: Secondary | ICD-10-CM | POA: Diagnosis not present

## 2017-10-06 DIAGNOSIS — I10 Essential (primary) hypertension: Secondary | ICD-10-CM | POA: Insufficient documentation

## 2017-10-06 DIAGNOSIS — I5022 Chronic systolic (congestive) heart failure: Secondary | ICD-10-CM

## 2017-10-06 DIAGNOSIS — R05 Cough: Secondary | ICD-10-CM | POA: Diagnosis present

## 2017-10-06 DIAGNOSIS — I110 Hypertensive heart disease with heart failure: Secondary | ICD-10-CM | POA: Insufficient documentation

## 2017-10-06 DIAGNOSIS — Z720 Tobacco use: Secondary | ICD-10-CM

## 2017-10-06 NOTE — Patient Instructions (Addendum)
Begin weighing daily and call for an overnight weight gain of > 2 pounds or a weekly weight gain of >5 pounds.  Drink 2 hospital cups of fluids daily.   Stop spironolactone completely.  Decrease furosemide to 1/2 tablet just once daily.

## 2017-10-06 NOTE — Progress Notes (Signed)
Patient ID: Erik Randall, male    DOB: 1926-12-01, 84 y.o.   MRN: [id number]  HPI  Erik Randall is a 84 y/o male with a history of HTN, previous tobacco use and chronic heart failure.   Echo report from 09/17/17 reviewed and showed an EF of 10% along with mild AR, severe Erik and elevated PA pressure of 53 mm Hg.   Admitted 09/17/17 due to acute on chronic HF. Initially needed IV lasix and then transitioned to oral diuretics. Cardiology consult obtained. Discharged the following day.   He presents today for his initial visit with a chief complaint of moderate fatigue upon minimal exertion. He describes this as chronic in nature having been present for several months. He has associated cough, shortness of breath and dizziness along with this. He denies any difficulty sleeping, abdominal distention, palpitations, pedal edema, chest pain or wheezing. Hasn't been weighing himself as he doesn't have a scale.   Past Medical History:  Diagnosis Date  . CHF (congestive heart failure) (HCC)   . Hypertension    History reviewed. No pertinent surgical history. History reviewed. No pertinent family history. Social History   Tobacco Use  . Smoking status: Former Games developer  . Smokeless tobacco: Never Used  Substance Use Topics  . Alcohol use: Never    Frequency: Never   No Known Allergies Prior to Admission medications   Medication Sig Start Date End Date Taking? Authorizing Provider  aspirin EC 81 MG tablet Take 81 mg by mouth 2 (two) times a week.    Yes [provider]  carvedilol (COREG) 3.125 MG tablet Take 3.125 mg by mouth 2 (two) times daily. 08/22/17  Yes [provider]  furosemide (LASIX) 20 MG tablet Take 1 tablet (20 mg total) by mouth daily. Patient taking differently: Take 10 mg by mouth twice daily 09/18/17  Yes Enedina Finner, MD  hydrOXYzine (ATARAX/VISTARIL) 10 MG tablet Take 10 mg by mouth at bedtime. 09/04/17  Yes [provider]  magnesium oxide (MAG-OX) 400  (241.3 Mg) MG tablet Take 1 tablet (400 mg total) by mouth daily. 09/18/17  Yes Enedina Finner, MD  sacubitril-valsartan (ENTRESTO) 24-26 MG Take 1 tablet by mouth 2 (two) times daily. 09/18/17  Yes Enedina Finner, MD  sprionolactone Take 1/2 tablet (12.5mg ) by mouth daily      triamcinolone ointment (KENALOG) 0.1 % Apply 1 application topically at bedtime. (avoid face, groin and armpits) 09/04/17  Yes [provider]       Review of Systems  Constitutional: Positive for appetite change (decreased) and fatigue (easily).  HENT: Positive for rhinorrhea. Negative for congestion and sore throat.   Eyes: Negative.   Respiratory: Positive for cough (productive white sputum) and shortness of breath. Negative for chest tightness and wheezing.   Cardiovascular: Negative for chest pain, palpitations and leg swelling.  Gastrointestinal: Negative for abdominal distention and abdominal pain.  Endocrine: Negative.   Genitourinary: Negative.   Musculoskeletal: Negative.   Skin: Negative.   Allergic/Immunologic: Negative.   Neurological: Positive for dizziness and light-headedness.  Hematological: Negative for adenopathy. Does not bruise/bleed easily.  Psychiatric/Behavioral: Negative for dysphoric mood and sleep disturbance (sleeping on 3 pillows). The patient is not nervous/anxious.    Vitals:   10/06/17 1104  BP: (!) 83/48  Pulse: 66  Resp: 18  Weight: 136 lb 8 oz (61.9 kg)  Height: 6\' 2"  (1.88 m)   Wt Readings from Last 3 Encounters:  10/06/17 136 lb 8 oz (61.9 kg)  09/17/17  145 lb (65.8 kg)   Lab Results  Component Value Date   CREATININE 1.01 09/17/2017   Physical Exam  Constitutional: He is oriented to person, place, and time. He appears well-developed and well-nourished.  HENT:  Head: Normocephalic and atraumatic.  Neck: Normal range of motion. Neck supple. No JVD present.  Cardiovascular: Normal rate and regular rhythm.  Pulmonary/Chest: Effort normal and breath sounds normal.  He has no wheezes. He has no rales.  Abdominal: Soft. He exhibits no distension.  Musculoskeletal: He exhibits no edema or tenderness.  Neurological: He is alert and oriented to person, place, and time.  Skin: Skin is warm and dry.  Psychiatric: He has a normal mood and affect. His behavior is normal. Thought content normal.  Nursing note and vitals reviewed.   Assessment & Plan:   1: Chronic heart failure with reduced ejection fraction- - NYHA class III - euvolemic today - not weighing daily; scales given to patient today and he was instructed to weigh daily, write the weight down and call for an overnight weight gain of >2 pounds or a weekly weight gain of >5 pounds - not adding salt and is trying to read food labels for sodium content. Reviewed the importance of closely following a 2000mg  sodium diet and written dietary information given to him about this - he is unsure of fluid intake and says that he doesn't drink much water but drinks a low of apple juice. Emphasized the importance of keeping daily fluid intake to ~ 60 ounces daily. [facility] hospital cup given to patient and explained that he should drink 2 of those cups of fluid daily - saw cardiology Gwen Pounds(Kowalski) 08/25/17 - BNP 09/17/17 was 1513.0  2: HTN- - BP low today & patient experiencing dizziness - stop spironolactone and decrease furosemide to 10mg  in the morning (was taking 10mg  BID) - BMP 10/02/17 reviewed and showed sodium 135, potassium 5.3 and GFR 54  3: Tobacco use- - patient quit smoking ~ 3 weeks ago - continued cessation discussed for 3 minutes with him  Medication bottles were reviewed.  Return in 3 weeks or sooner for any questions/problems before then.

## 2017-10-09 ENCOUNTER — Telehealth: Payer: Self-pay

## 2017-10-09 ENCOUNTER — Other Ambulatory Visit: Payer: Self-pay

## 2017-10-09 ENCOUNTER — Emergency Department: Payer: Medicare Other

## 2017-10-09 ENCOUNTER — Encounter: Payer: Self-pay | Admitting: Emergency Medicine

## 2017-10-09 ENCOUNTER — Inpatient Hospital Stay
Admission: EM | Admit: 2017-10-09 | Discharge: 2017-10-13 | DRG: 302 | Disposition: A | Discharge status: Home Health | Payer: Medicare Other | Attending: Internal Medicine | Admitting: Internal Medicine

## 2017-10-09 DIAGNOSIS — Z79899 Other long term (current) drug therapy: Secondary | ICD-10-CM

## 2017-10-09 DIAGNOSIS — Z7982 Long term (current) use of aspirin: Secondary | ICD-10-CM

## 2017-10-09 DIAGNOSIS — I255 Ischemic cardiomyopathy: Secondary | ICD-10-CM | POA: Diagnosis not present

## 2017-10-09 DIAGNOSIS — E875 Hyperkalemia: Secondary | ICD-10-CM | POA: Diagnosis present

## 2017-10-09 DIAGNOSIS — E871 Hypo-osmolality and hyponatremia: Secondary | ICD-10-CM | POA: Diagnosis present

## 2017-10-09 DIAGNOSIS — Z515 Encounter for palliative care: Secondary | ICD-10-CM

## 2017-10-09 DIAGNOSIS — I083 Combined rheumatic disorders of mitral, aortic and tricuspid valves: Secondary | ICD-10-CM | POA: Diagnosis present

## 2017-10-09 DIAGNOSIS — E861 Hypovolemia: Secondary | ICD-10-CM | POA: Diagnosis present

## 2017-10-09 DIAGNOSIS — R627 Adult failure to thrive: Secondary | ICD-10-CM | POA: Diagnosis present

## 2017-10-09 DIAGNOSIS — Z681 Body mass index (BMI) 19 or less, adult: Secondary | ICD-10-CM

## 2017-10-09 DIAGNOSIS — R531 Weakness: Secondary | ICD-10-CM

## 2017-10-09 DIAGNOSIS — I959 Hypotension, unspecified: Secondary | ICD-10-CM | POA: Diagnosis present

## 2017-10-09 DIAGNOSIS — I5022 Chronic systolic (congestive) heart failure: Secondary | ICD-10-CM | POA: Diagnosis present

## 2017-10-09 DIAGNOSIS — I509 Heart failure, unspecified: Secondary | ICD-10-CM

## 2017-10-09 DIAGNOSIS — Z8701 Personal history of pneumonia (recurrent): Secondary | ICD-10-CM

## 2017-10-09 DIAGNOSIS — I110 Hypertensive heart disease with heart failure: Secondary | ICD-10-CM | POA: Diagnosis present

## 2017-10-09 DIAGNOSIS — E43 Unspecified severe protein-calorie malnutrition: Secondary | ICD-10-CM | POA: Diagnosis present

## 2017-10-09 DIAGNOSIS — I447 Left bundle-branch block, unspecified: Secondary | ICD-10-CM | POA: Diagnosis present

## 2017-10-09 DIAGNOSIS — Z66 Do not resuscitate: Secondary | ICD-10-CM | POA: Diagnosis not present

## 2017-10-09 DIAGNOSIS — N179 Acute kidney failure, unspecified: Secondary | ICD-10-CM | POA: Diagnosis present

## 2017-10-09 DIAGNOSIS — Z8249 Family history of ischemic heart disease and other diseases of the circulatory system: Secondary | ICD-10-CM

## 2017-10-09 DIAGNOSIS — I2510 Atherosclerotic heart disease of native coronary artery without angina pectoris: Secondary | ICD-10-CM | POA: Diagnosis present

## 2017-10-09 DIAGNOSIS — Z7189 Other specified counseling: Secondary | ICD-10-CM

## 2017-10-09 DIAGNOSIS — Z87891 Personal history of nicotine dependence: Secondary | ICD-10-CM

## 2017-10-09 LAB — CBC WITH DIFFERENTIAL/PLATELET
BASOS ABS: 0 10*3/uL (ref 0–0.1)
BASOS PCT: 2 %
EOS ABS: 0.1 10*3/uL (ref 0–0.7)
Eosinophils Relative: 4 %
HEMATOCRIT: 41.8 % (ref 40.0–52.0)
HEMOGLOBIN: 14 g/dL (ref 13.0–18.0)
Lymphocytes Relative: 34 %
Lymphs Abs: 1 10*3/uL (ref 1.0–3.6)
MCH: 30.9 pg (ref 26.0–34.0)
MCHC: 33.4 g/dL (ref 32.0–36.0)
MCV: 92.5 fL (ref 80.0–100.0)
Monocytes Absolute: 0.2 10*3/uL (ref 0.2–1.0)
Monocytes Relative: 8 %
NEUTROS ABS: 1.6 10*3/uL (ref 1.4–6.5)
Neutrophils Relative %: 52 %
Platelets: 192 10*3/uL (ref 150–440)
RBC: 4.51 MIL/uL (ref 4.40–5.90)
RDW: 12.9 % (ref 11.5–14.5)
WBC: 3 10*3/uL — ABNORMAL LOW (ref 3.8–10.6)

## 2017-10-09 LAB — URINALYSIS, COMPLETE (UACMP) WITH MICROSCOPIC
Bacteria, UA: NONE SEEN
Bilirubin Urine: NEGATIVE
GLUCOSE, UA: NEGATIVE mg/dL
Hgb urine dipstick: NEGATIVE
Ketones, ur: NEGATIVE mg/dL
Leukocytes, UA: NEGATIVE
NITRITE: NEGATIVE
PROTEIN: NEGATIVE mg/dL
Specific Gravity, Urine: 1.01 (ref 1.005–1.030)
Squamous Epithelial / LPF: NONE SEEN (ref 0–5)
WBC UA: NONE SEEN WBC/hpf (ref 0–5)
pH: 7 (ref 5.0–8.0)

## 2017-10-09 LAB — COMPREHENSIVE METABOLIC PANEL
ALBUMIN: 4.3 g/dL (ref 3.5–5.0)
ALT: 18 U/L (ref 0–44)
ANION GAP: 8 (ref 5–15)
AST: 26 U/L (ref 15–41)
Alkaline Phosphatase: 85 U/L (ref 38–126)
BILIRUBIN TOTAL: 1.2 mg/dL (ref 0.3–1.2)
BUN: 37 mg/dL — AB (ref 8–23)
CHLORIDE: 101 mmol/L (ref 98–111)
CO2: 24 mmol/L (ref 22–32)
Calcium: 9.6 mg/dL (ref 8.9–10.3)
Creatinine, Ser: 1.45 mg/dL — ABNORMAL HIGH (ref 0.61–1.24)
GFR calc Af Amer: 48 mL/min — ABNORMAL LOW (ref >60)
GFR calc non Af Amer: 41 mL/min — ABNORMAL LOW (ref >60)
GLUCOSE: 87 mg/dL (ref 70–99)
POTASSIUM: 5.4 mmol/L — AB (ref 3.5–5.1)
SODIUM: 133 mmol/L — AB (ref 135–145)
Total Protein: 8.2 g/dL — ABNORMAL HIGH (ref 6.5–8.1)

## 2017-10-09 LAB — TROPONIN I
TROPONIN I: 0.06 ng/mL — AB (ref <0.03)
Troponin I: 0.05 ng/mL (ref <0.03)
Troponin I: 0.06 ng/mL (ref <0.03)

## 2017-10-09 LAB — LACTIC ACID, PLASMA: Lactic Acid, Venous: 1.3 mmol/L (ref 0.5–1.9)

## 2017-10-09 LAB — BRAIN NATRIURETIC PEPTIDE: B Natriuretic Peptide: 386 pg/mL — ABNORMAL HIGH (ref 0.0–100.0)

## 2017-10-09 MED ORDER — ONDANSETRON HCL 4 MG PO TABS: 4.0000 mg | ORAL_TABLET | Freq: Four times a day (QID) | ORAL | Status: DC | PRN

## 2017-10-09 MED ORDER — ASPIRIN EC 81 MG PO TBEC
81.0000 mg | DELAYED_RELEASE_TABLET | ORAL | Status: DC
  Administered 2017-10-09 – 2017-10-12 (×2): 81 mg via ORAL
  Filled 2017-10-09 (×2): qty 1

## 2017-10-09 MED ORDER — ACETAMINOPHEN 325 MG PO TABS: 650.0000 mg | ORAL_TABLET | Freq: Four times a day (QID) | ORAL | Status: DC | PRN

## 2017-10-09 MED ORDER — SODIUM CHLORIDE 0.9 % IV SOLN
INTRAVENOUS | Status: AC
  Administered 2017-10-09 – 2017-10-10 (×2): via INTRAVENOUS

## 2017-10-09 MED ORDER — SENNOSIDES-DOCUSATE SODIUM 8.6-50 MG PO TABS
1.0000 | ORAL_TABLET | Freq: Every evening | ORAL | Status: DC | PRN
Start: 2017-10-09 — End: 2017-10-13

## 2017-10-09 MED ORDER — ONDANSETRON HCL 4 MG/2ML IJ SOLN: 4.0000 mg | Freq: Four times a day (QID) | INTRAMUSCULAR | Status: DC | PRN

## 2017-10-09 MED ORDER — SODIUM CHLORIDE 0.9 % IV BOLUS
250.0000 mL | Freq: Once | INTRAVENOUS | Status: AC
  Administered 2017-10-09: 250 mL via INTRAVENOUS

## 2017-10-09 MED ORDER — MAGNESIUM OXIDE 400 (241.3 MG) MG PO TABS
400.0000 mg | ORAL_TABLET | Freq: Every day | ORAL | Status: DC
  Administered 2017-10-10 – 2017-10-13 (×4): 400 mg via ORAL
  Filled 2017-10-09 (×4): qty 1

## 2017-10-09 MED ORDER — ALBUTEROL SULFATE (2.5 MG/3ML) 0.083% IN NEBU: 2.5000 mg | INHALATION_SOLUTION | RESPIRATORY_TRACT | Status: DC | PRN

## 2017-10-09 MED ORDER — HYDROXYZINE HCL 10 MG PO TABS
10.0000 mg | ORAL_TABLET | Freq: Every day | ORAL | Status: DC
  Administered 2017-10-09 – 2017-10-10 (×2): 10 mg via ORAL
  Filled 2017-10-09 (×3): qty 1

## 2017-10-09 MED ORDER — HEPARIN SODIUM (PORCINE) 5000 UNIT/ML IJ SOLN
5000.0000 [IU] | Freq: Three times a day (TID) | INTRAMUSCULAR | Status: DC
  Administered 2017-10-09 – 2017-10-13 (×11): 5000 [IU] via SUBCUTANEOUS
  Filled 2017-10-09 (×9): qty 1

## 2017-10-09 MED ORDER — ACETAMINOPHEN 650 MG RE SUPP: 650.0000 mg | Freq: Four times a day (QID) | RECTAL | Status: DC | PRN

## 2017-10-09 NOTE — Consult Note (Signed)
[facility] Cardiology  CARDIOLOGY CONSULT NOTE  Patient ID: Erik Randall MRN: [id number] DOB/AGE: 1926/04/10 84 y.o.  Admit date: 10/09/2017 Referring Physician Imogene Burn Primary Physician [facility] Primary Cardiologist Gwen Pounds Reason for Consultation Hypotension, chronic systolic CHF  HPI: 84 year old male referred for evaluation of hypotension with a history of chronic systolic CHF with LVEF 10%. The patient has a history of ischemic cardiomyopathy, severe mitral regurgitation, former tobacco abuse, and hypertension. The patient reports a one week history of progressive shortness of breath and weakness without peripheral edema. His wife checked his blood pressure at home this morning and was unable to get a reading. She drove to the local fire department, and his systolic blood pressure was reportedly in the high 60s. The wife drove the patient to [facility] ER for further evaluation. Blood pressure was 82/66 upon arrival. His heart failure medications were held and the patient was given gentle IV fluids. Admission labs notable for K 5.4, creatinine 1.45, BUN 37, and initial troponin 0.06. Chest xray revealed stable bibasilar subsegmental atelectasis or scarring. ECG revealed sinus rhythm at a rate of 60 bpm with IVCD and nonspecific ST-T wave abnormalities. The patient was recently admitted on 09/17/17 for acute on chronic CHF, treated with IV diuretics. Troponin at the time of discharge was 0.08. 2D echocardiogram on 09/17/2017 revealed LVEF 10% with severe mitral regurgitation, mild aortic and tricuspid regurgitation, and elevated right ventricular systolic pressure.  Currently, the patient reports feeling fine while lying in bed. He denies shortness of breath, palpitations, or chest pain.  Review of systems complete and found to be negative unless listed above     Past Medical History:  Diagnosis Date  . CHF (congestive heart failure) (HCC)   . Hypertension     History reviewed. No pertinent surgical  history.  Medications Prior to Admission  Medication Sig Dispense Refill Last Dose  . aspirin EC 81 MG tablet Take 81 mg by mouth 2 (two) times a week. On Monday and Wednesday   10/04/2017 at am  . carvedilol (COREG) 3.125 MG tablet Take 3.125 mg by mouth 2 (two) times daily.  11 10/09/2017 at 0830  . furosemide (LASIX) 20 MG tablet Take 1 tablet (20 mg total) by mouth daily. (Patient taking differently: Take 10 mg by mouth daily. ) 30 tablet 3 10/09/2017 at am  . hydrOXYzine (ATARAX/VISTARIL) 10 MG tablet Take 10 mg by mouth at bedtime.  0 10/08/2017 at pm  . magnesium oxide (MAG-OX) 400 (241.3 Mg) MG tablet Take 1 tablet (400 mg total) by mouth daily. 30 tablet 0 10/09/2017 at am  . sacubitril-valsartan (ENTRESTO) 24-26 MG Take 1 tablet by mouth 2 (two) times daily. 60 tablet 2 10/09/2017 at am  . triamcinolone ointment (KENALOG) 0.1 % Apply 1 application topically at bedtime. (avoid face, groin and armpits)  0 10/08/2017 at pm   Social History   Socioeconomic History  . Marital status: Married    Spouse name: Not on file  . Number of children: Not on file  . Years of education: Not on file  . Highest education level: Not on file  Occupational History  . Not on file  Social Needs  . Financial resource strain: Not on file  . Food insecurity:    Worry: Not on file    Inability: Not on file  . Transportation needs:    Medical: Not on file    Non-medical: Not on file  Tobacco Use  . Smoking status: Former Games developer  . Smokeless tobacco: Never Used  Substance and Sexual Activity  . Alcohol use: Never    Frequency: Never  . Drug use: Never  . Sexual activity: Not on file  Lifestyle  . Physical activity:    Days per week: Not on file    Minutes per session: Not on file  . Stress: Not on file  Relationships  . Social connections:    Talks on phone: Not on file    Gets together: Not on file    Attends religious service: Not on file    Active member of club or organization: Not on file     Attends meetings of clubs or organizations: Not on file    Relationship status: Not on file  . Intimate partner violence:    Fear of current or ex partner: Not on file    Emotionally abused: Not on file    Physically abused: Not on file    Forced sexual activity: Not on file  Other Topics Concern  . Not on file  Social History Narrative  . Not on file    Family History  Problem Relation Age of Onset  . Stroke Mother   . Hypertension Father   . Heart attack Father   . Hypertension Sister       Review of systems complete and found to be negative unless listed above      PHYSICAL EXAM  General: Frail elderly gentleman lying supine in bed in no acute distress HEENT:  Normocephalic and atramatic Neck:  No JVD.  Lungs: Clear bilaterally to auscultation, no wheezing or crackles. Normal effort of breathing on room air. Heart: HRRR, 2/6 systolic murmur Abdomen: Bowel sounds are positive, abdomen soft Msk:  Back normal, able to sit upright in bed without difficulty. Normal strength and tone for age. Extremities: No clubbing, cyanosis or edema.   Neuro: Alert and oriented X 3. Psych:  Good affect, responds appropriately  Labs:   Lab Results  Component Value Date   WBC 3.0 (L) 10/09/2017   HGB 14.0 10/09/2017   HCT 41.8 10/09/2017   MCV 92.5 10/09/2017   PLT 192 10/09/2017    Recent Labs  Lab 10/09/17 1038  NA 133*  K 5.4*  CL 101  CO2 24  BUN 37*  CREATININE 1.45*  CALCIUM 9.6  PROT 8.2*  BILITOT 1.2  ALKPHOS 85  ALT 18  AST 26  GLUCOSE 87   Lab Results  Component Value Date   TROPONINI 0.06 (HH) 10/09/2017   No results found for: CHOL No results found for: HDL No results found for: LDLCALC No results found for: TRIG No results found for: CHOLHDL No results found for: LDLDIRECT    Radiology: Dg Chest 2 View  Result Date: 09/17/2017 CLINICAL DATA:  Dyspnea EXAM: CHEST - 2 VIEW COMPARISON:  11/13/2011 chest radiograph. FINDINGS: Stable  cardiomediastinal silhouette with mild cardiomegaly. No pneumothorax. Trace bilateral pleural effusions. Emphysema with hyperinflated lungs. Mild diffuse prominence of the parahilar interstitial markings. Mild hazy opacities at the lung bases, slightly increased. IMPRESSION: 1. Trace bilateral pleural effusions. 2. Stable mild cardiomegaly with mild diffuse prominence of the parahilar interstitial markings, cannot exclude mild pulmonary edema. 3. Mild hazy opacities at the lung bases, slightly increased, which could represent aspiration or pneumonia. Electronically Signed   By: Delbert Phenix M.D.   On: 09/17/2017 07:04   Dg Chest Port 1 View  Result Date: 10/09/2017 CLINICAL DATA:  Hypertension. EXAM: PORTABLE CHEST 1 VIEW COMPARISON:  Radiographs of September 17, 2017. FINDINGS: Stable  cardiomediastinal silhouette. No pneumothorax is noted. Emphysematous disease is noted in both upper lobes. Stable bibasilar subsegmental atelectasis or scarring is noted. Bony thorax is unremarkable. IMPRESSION: Stable bibasilar subsegmental atelectasis or scarring. Emphysema (ICD10-J43.9). Electronically Signed   By: Lupita RaiderJames  Green Jr, M.D.   On: 10/09/2017 10:58    EKG: Sinus rhythm, 59 bpm  ASSESSMENT AND PLAN:  1. Hypotension, symptomatic, receiving gentle IV fluids. 2. Chronic systolic congestive heart failure, recently treated for exacerbation with IV diuretic 3. Severe mitral regurgitation 4. Hyperkalemia, 5.4 5. Borderline elevated troponin, at patient's baseline, in the absence of chest pain 6. Acute kidney injury, creatinine 1.45   Plan: 1. Agree with holding carvedilol, Entresto, furosemide, and spironolactone for now while hypotensive. Will plan to gradually resume as blood pressure permits. 2. With decreased LV function with IVCD, the patient would be a good candidate for CRT-D and comorbities, but due to his advanced age, will defer. 3. Avoid overhydrating to prevent CHF exacerbation. 4. No further cardiac  diagnostics recommended at this time. 5. Further recommendations pending patient's initial course.  Signed: Leanora IvanoffAnna Kentrell Hallahan MD,PhD, [facility] 10/09/2017, 4:37 PM

## 2017-10-09 NOTE — Progress Notes (Signed)
Advanced Care Plan.  Purpose of Encounter: CODE STATUS. Parties in Attendance: The patient and me. Patient's Decisional Capacity: Yes. Medical Story: Erik Randall  is a 84 y.o. male with a known history of hypertension and chronic systolic CHF with ejection fraction 10%.  He was just recently admitted for fluid overload and treated with diuretic.  He complains of generalized weakness and dizziness.  His wife checked his blood pressure, and unable to get a reading.  So he was sent to ED for further evaluation blood pressure was low at 70s.  He is being to be through hypotension and renal failure.  I discussed with the patient about her current condition, poor prognosis and CODE STATUS.  The patient wants full code. Plan:  Code Status: Full code for now. Palliative care consult. Time spent discussing advance care planning: 16-17 minutes.

## 2017-10-09 NOTE — ED Notes (Signed)
Pt resting in stretcher, a & o x4. ABCs intact. RR even and unlabored. Color WNL. NAD. Pt and wife at bedside aware we are awaiting an inpt observation bed. Pt provided with water/crackers/peanut butter. Pt and wife deny further needs/complaints. Call bell within reach. Will continue to monitor.

## 2017-10-09 NOTE — ED Notes (Signed)
Patients wife reports she brought patient to ER since she could not get an b/p to measure at home. Patient A&O x4 during assessment. Denies pain

## 2017-10-09 NOTE — Telephone Encounter (Signed)
Ms. Cathlean CowerBaldwin contacted the office with concerns for Mr. Schwieger's blood pressure. She states that she took a reading yesterday 7/28 and his reading in the morning was 67/54 and at night it was 68/42. She states that she has been trying to obtain a reading this morning and can not get a pressure. She states that Mr. Cathlean CowerBaldwin is not complaining of any symptoms and that he feels ok.   Per [facility] Hackney FNP, with patient experiencing such low pressures it may be best for patient to be evaluated in the ED. Ms. Cathlean CowerBaldwin was advised and will take patient to be seen.

## 2017-10-09 NOTE — H&P (Signed)
[employer] [facility] [facility] [place] [facility]   PATIENT NAME: Erik Randall    MR#:  [id number]  DATE OF BIRTH:  [birth year]  DATE OF ADMISSION:  10/09/2017  PRIMARY CARE PHYSICIAN: Dorothey Baseman, MD   REQUESTING/REFERRING PHYSICIAN: Dr. Roxan Hockey.  CHIEF COMPLAINT:   Chief Complaint  Patient presents with  . Hypotension   Dizziness and hypotension. HISTORY OF PRESENT ILLNESS:  Erik Randall  is a 84 y.o. male with a known history of hypertension and chronic systolic CHF with ejection fraction 10%.  He was just recently admitted for fluid overload and treated with diuretic.  He complains of generalized weakness and dizziness.  His wife checked his blood pressure, and unable to get a reading.  So he was sent to ED for further evaluation blood pressure was low at 70s.  He is treated with normal saline IV in the ED.  The patient denies any other symptoms.  PAST MEDICAL HISTORY:   Past Medical History:  Diagnosis Date  . CHF (congestive heart failure) (HCC)   . Hypertension     PAST SURGICAL HISTORY:  History reviewed. No pertinent surgical history.  SOCIAL HISTORY:   Social History   Tobacco Use  . Smoking status: Former Games developer  . Smokeless tobacco: Never Used  Substance Use Topics  . Alcohol use: Never    Frequency: Never    FAMILY HISTORY:   Family History  Problem Relation Age of Onset  . Stroke Mother   . Hypertension Father   . Heart attack Father   . Hypertension Sister     DRUG ALLERGIES:  No Known Allergies  REVIEW OF SYSTEMS:   Review of Systems  Constitutional: Positive for malaise/fatigue. Negative for chills and fever.  HENT: Negative for sore throat.   Eyes: Negative for blurred vision and double vision.  Respiratory: Negative for cough, hemoptysis, shortness of breath, wheezing and stridor.   Cardiovascular: Negative for chest pain, palpitations, orthopnea and leg swelling.  Gastrointestinal: Negative for abdominal pain, blood  in stool, diarrhea, melena, nausea and vomiting.  Genitourinary: Negative for dysuria, flank pain and hematuria.  Musculoskeletal: Negative for back pain and joint pain.  Skin: Negative for rash.  Neurological: Positive for dizziness. Negative for sensory change, focal weakness, seizures, loss of consciousness, weakness and headaches.  Endo/Heme/Allergies: Negative for polydipsia.  Psychiatric/Behavioral: Negative for depression. The patient is not nervous/anxious.     MEDICATIONS AT HOME:   Prior to Admission medications   Medication Sig Start Date End Date Taking? Authorizing Provider  aspirin EC 81 MG tablet Take 81 mg by mouth 2 (two) times a week. On Monday and Wednesday   Yes [provider]  carvedilol (COREG) 3.125 MG tablet Take 3.125 mg by mouth 2 (two) times daily. 08/22/17  Yes [provider]  furosemide (LASIX) 20 MG tablet Take 1 tablet (20 mg total) by mouth daily. Patient taking differently: Take 10 mg by mouth daily.  09/18/17  Yes Enedina Finner, MD  hydrOXYzine (ATARAX/VISTARIL) 10 MG tablet Take 10 mg by mouth at bedtime. 09/04/17  Yes [provider]  magnesium oxide (MAG-OX) 400 (241.3 Mg) MG tablet Take 1 tablet (400 mg total) by mouth daily. 09/18/17  Yes Enedina Finner, MD  sacubitril-valsartan (ENTRESTO) 24-26 MG Take 1 tablet by mouth 2 (two) times daily. 09/18/17  Yes Enedina Finner, MD  triamcinolone ointment (KENALOG) 0.1 % Apply 1 application topically at bedtime. (avoid face, groin and armpits) 09/04/17  Yes [provider]  VITAL SIGNS:  Blood pressure 90/71, pulse 71, temperature (!) 97.4 F (36.3 C), temperature source Oral, resp. rate (!) 26, weight 136 lb (61.7 kg), SpO2 100 %.  PHYSICAL EXAMINATION:  Physical Exam  GENERAL:  84 y.o.-year-old patient lying in the bed with no acute distress.  EYES: Pupils equal, round, reactive to light and accommodation. No scleral icterus. Extraocular muscles intact.  HEENT: Head  atraumatic, normocephalic. Oropharynx and nasopharynx clear.  NECK:  Supple, no jugular venous distention. No thyroid enlargement, no tenderness.  LUNGS: Normal breath sounds bilaterally, no wheezing, rales,rhonchi or crepitation. No use of accessory muscles of respiration.  CARDIOVASCULAR: S1, S2 normal. No murmurs, rubs, or gallops.  ABDOMEN: Soft, nontender, nondistended. Bowel sounds present. No organomegaly or mass.  EXTREMITIES: No pedal edema, cyanosis, or clubbing.  NEUROLOGIC: Cranial nerves II through XII are intact. Muscle strength 5/5 in all extremities. Sensation intact. Gait not checked.  PSYCHIATRIC: The patient is alert and oriented x 3.  SKIN: No obvious rash, lesion, or ulcer.   LABORATORY PANEL:   CBC Recent Labs  Lab 10/09/17 1038  WBC 3.0*  HGB 14.0  HCT 41.8  PLT 192   ------------------------------------------------------------------------------------------------------------------  Chemistries  Recent Labs  Lab 10/09/17 1038  NA 133*  K 5.4*  CL 101  CO2 24  GLUCOSE 87  BUN 37*  CREATININE 1.45*  CALCIUM 9.6  AST 26  ALT 18  ALKPHOS 85  BILITOT 1.2   ------------------------------------------------------------------------------------------------------------------  Cardiac Enzymes Recent Labs  Lab 10/09/17 1038  TROPONINI 0.06*   ------------------------------------------------------------------------------------------------------------------  RADIOLOGY:  Dg Chest Port 1 View  Result Date: 10/09/2017 CLINICAL DATA:  Hypertension. EXAM: PORTABLE CHEST 1 VIEW COMPARISON:  Radiographs of September 17, 2017. FINDINGS: Stable cardiomediastinal silhouette. No pneumothorax is noted. Emphysematous disease is noted in both upper lobes. Stable bibasilar subsegmental atelectasis or scarring is noted. Bony thorax is unremarkable. IMPRESSION: Stable bibasilar subsegmental atelectasis or scarring. Emphysema (ICD10-J43.9). Electronically Signed   By: Lupita RaiderJames  Green  Jr, M.D.   On: 10/09/2017 10:58      IMPRESSION AND PLAN:   Hypotension. The patient will be placed for observation. Hold diuretics and Entresto due to hypotension.  Give gentle rehydration.  Acute renal failure.  Hold diuretics and Entresto, gentle normal saline IV, follow-up BMP.  Hyponatremia and hyperkalemia.  Normal saline IV, hold Entresto and follow-up BMP.  Elevated troponin due to above.  Follow-up troponin level.  Chronic systolic CHF with ejection fraction 10%.  Hold diuretics and Entresto, cardiology consult to adjust medication.  All the records are reviewed and case discussed with ED provider. Management plans discussed with the patient, family and they are in agreement.  CODE STATUS: Full code.  TOTAL TIME TAKING CARE OF THIS PATIENT: 38 minutes.    Shaune PollackQing Cortez Steelman M.D on 10/09/2017 at 1:18 PM  Between 7am to 6pm - Pager - [phone]  After 6pm go to [email or web address] - Social research officer, [email or web address] [email or web address]  [facility]  Office  [phone]  CC: Primary care physician; Dorothey BasemanBronstein, David, MD   Note: This dictation was prepared with Dragon dictation along with smaller phrase technology. Any transcriptional errors that result from this process are unin

## 2017-10-09 NOTE — ED Notes (Signed)
Lab called. Trop 0.06. MD notified.

## 2017-10-09 NOTE — ED Provider Notes (Signed)
[facility] Emergency Department Provider Note    First MD Initiated Contact with Patient 10/09/17 1030     (approximate)  I have reviewed the triage vital signs and the nursing notes.   HISTORY  Chief Complaint Hypotension    HPI Erik Randall is a 84 y.o. male with a history of congestive heart failure with severe MVR presents the ER for generalized weakness low blood pressure.  Patient was complaining of weakness his wife checked his blood pressure and was unable to get a reading.  They then went to the fire department where first responders checked her blood pressure and only got 68 repalpation.  She then drove him to the ER for further evaluation.  Was reportedly having a low systolic blood pressures at heart failure clinic last Friday and was told to decrease his diuretic.  Patient did comply with this and has had good p.o. intake over the past week but still feels fatigued.  Denies any chest pain or pressure.  No fevers.  No diarrhea.  No dysuria.  No leg swelling.  No orthopnea.  States that yesterday when he was standing up he nearly fell over due to weakness but was caught by his wife.    Past Medical History:  Diagnosis Date  . CHF (congestive heart failure) (HCC)   . Hypertension    History reviewed. No pertinent family history. History reviewed. No pertinent surgical history. Patient Active Problem List   Diagnosis Date Noted  . HTN (hypertension) 10/06/2017  . Tobacco use 10/06/2017  . CHF (congestive heart failure) (HCC) 09/18/2017  . Pneumonia 09/17/2017      Prior to Admission medications   Medication Sig Start Date End Date Taking? Authorizing Provider  aspirin EC 81 MG tablet Take 81 mg by mouth 2 (two) times a week. On Monday and Wednesday   Yes [provider]  carvedilol (COREG) 3.125 MG tablet Take 3.125 mg by mouth 2 (two) times daily. 08/22/17  Yes [provider]  furosemide (LASIX) 20 MG tablet Take 1  tablet (20 mg total) by mouth daily. Patient taking differently: Take 10 mg by mouth daily.  09/18/17  Yes Enedina FinnerPatel, Sona, MD  hydrOXYzine (ATARAX/VISTARIL) 10 MG tablet Take 10 mg by mouth at bedtime. 09/04/17  Yes [provider]  magnesium oxide (MAG-OX) 400 (241.3 Mg) MG tablet Take 1 tablet (400 mg total) by mouth daily. 09/18/17  Yes Enedina FinnerPatel, Sona, MD  sacubitril-valsartan (ENTRESTO) 24-26 MG Take 1 tablet by mouth 2 (two) times daily. 09/18/17  Yes Enedina FinnerPatel, Sona, MD  triamcinolone ointment (KENALOG) 0.1 % Apply 1 application topically at bedtime. (avoid face, groin and armpits) 09/04/17  Yes [provider]    Allergies Patient has no known allergies.    Social History Social History   Tobacco Use  . Smoking status: Former Games developermoker  . Smokeless tobacco: Never Used  Substance Use Topics  . Alcohol use: Never    Frequency: Never  . Drug use: Never    Review of Systems Patient denies headaches, rhinorrhea, blurry vision, numbness, shortness of breath, chest pain, edema, cough, abdominal pain, nausea, vomiting, diarrhea, dysuria, fevers, rashes or hallucinations unless otherwise stated above in HPI. ____________________________________________   PHYSICAL EXAM:  VITAL SIGNS: Vitals:   10/09/17 1145 10/09/17 1200  BP: (!) 82/71 (!) 84/66  Pulse: 71   Resp:  19  Temp:    SpO2: 100%     Constitutional: Alert and oriented. Frail and weak appearing Eyes: Conjunctivae are  normal.  Head: Atraumatic. Nose: No congestion/rhinnorhea. Mouth/Throat: Mucous membranes are moist.   Neck: No stridor. Painless ROM.  Cardiovascular: Normal rate, regular rhythm. Good peripheral circulation. Respiratory: Normal respiratory effort.  No retractions. Lungs CTAB. Gastrointestinal: Soft and nontender. No distention. No abdominal bruits. No CVA tenderness. Genitourinary: deferred Musculoskeletal: No lower extremity tenderness nor edema.  No joint effusions. Neurologic:  Normal speech  and language. No gross focal neurologic deficits are appreciated. No facial droop Skin:  Skin is warm, dry and intact. No rash noted. Psychiatric: Mood and affect are normal. Speech and behavior are normal.  ____________________________________________   LABS (all labs ordered are listed, but only abnormal results are displayed)  Results for orders placed or performed during the hospital encounter of 10/09/17 (from the past 24 hour(s))  Lactic acid, plasma     Status: None   Collection Time: 10/09/17 10:38 AM  Result Value Ref Range   Lactic Acid, Venous 1.3 0.5 - 1.9 mmol/L  Comprehensive metabolic panel     Status: Abnormal   Collection Time: 10/09/17 10:38 AM  Result Value Ref Range   Sodium 133 (L) 135 - 145 mmol/L   Potassium 5.4 (H) 3.5 - 5.1 mmol/L   Chloride 101 98 - 111 mmol/L   CO2 24 22 - 32 mmol/L   Glucose, Bld 87 70 - 99 mg/dL   BUN 37 (H) 8 - 23 mg/dL   Creatinine, Ser 1.61 (H) 0.61 - 1.24 mg/dL   Calcium 9.6 8.9 - 09.6 mg/dL   Total Protein 8.2 (H) 6.5 - 8.1 g/dL   Albumin 4.3 3.5 - 5.0 g/dL   AST 26 15 - 41 U/L   ALT 18 0 - 44 U/L   Alkaline Phosphatase 85 38 - 126 U/L   Total Bilirubin 1.2 0.3 - 1.2 mg/dL   GFR calc non Af Amer 41 (L) >60 mL/min   GFR calc Af Amer 48 (L) >60 mL/min   Anion gap 8 5 - 15  Troponin I     Status: Abnormal   Collection Time: 10/09/17 10:38 AM  Result Value Ref Range   Troponin I 0.06 (HH) <0.03 ng/mL  CBC WITH DIFFERENTIAL     Status: Abnormal   Collection Time: 10/09/17 10:38 AM  Result Value Ref Range   WBC 3.0 (L) 3.8 - 10.6 K/uL   RBC 4.51 4.40 - 5.90 MIL/uL   Hemoglobin 14.0 13.0 - 18.0 g/dL   HCT 04.5 40.9 - 81.1 %   MCV 92.5 80.0 - 100.0 fL   MCH 30.9 26.0 - 34.0 pg   MCHC 33.4 32.0 - 36.0 g/dL   RDW 91.4 78.2 - 95.6 %   Platelets 192 150 - 440 K/uL   Neutrophils Relative % 52 %   Neutro Abs 1.6 1.4 - 6.5 K/uL   Lymphocytes Relative 34 %   Lymphs Abs 1.0 1.0 - 3.6 K/uL   Monocytes Relative 8 %   Monocytes  Absolute 0.2 0.2 - 1.0 K/uL   Eosinophils Relative 4 %   Eosinophils Absolute 0.1 0 - 0.7 K/uL   Basophils Relative 2 %   Basophils Absolute 0.0 0 - 0.1 K/uL  Brain natriuretic peptide     Status: Abnormal   Collection Time: 10/09/17 10:38 AM  Result Value Ref Range   B Natriuretic Peptide 386.0 (H) 0.0 - 100.0 pg/mL   ____________________________________________  EKG My review and personal interpretation at Time: 10:29   Indication: hypotension  Rate: 60  Rhythm: sinus Axis: normal Other:  Lbbb, nonspecific st and t wave abn, no stemi   My review and personal interpretation at Time: 10:41 Indication: hypotension  Rate: 60  Rhythm: sinus Axis: normal Other:  Lbbb, nonspecific st and t wave abn, no stemi lateral st changes likely 2/2 new lead placement  ____________________________________________  RADIOLOGY  I personally reviewed all radiographic images ordered to evaluate for the above acute complaints and reviewed radiology reports and findings.  These findings were personally discussed with the patient.  Please see medical record for radiology report.  ____________________________________________   PROCEDURES  Procedure(s) performed:  .Critical Care Performed by: Willy Eddy, MD Authorized by: Willy Eddy, MD   Critical care provider statement:    Critical care time (minutes):  30   Critical care was necessary to treat or prevent imminent or life-threatening deterioration of the following conditions:  Circulatory failure   Critical care was time spent personally by me on the following activities:  Discussions with consultants, evaluation of patient's response to treatment, examination of patient, ordering and performing treatments and interventions, ordering and review of laboratory studies, ordering and review of radiographic studies, pulse oximetry, re-evaluation of patient's condition, obtaining history from patient or surrogate and review of old  charts      Critical Care performed: YES ____________________________________________   INITIAL IMPRESSION / ASSESSMENT AND PLAN / ED COURSE  Pertinent labs & imaging results that were available during my care of the patient were reviewed by me and considered in my medical decision making (see chart for details).   DDX: chf, dehydration, electrolyte abn, aki, pna, sepsis, anemia  Erik Randall is a 84 y.o. who presents to the ED with symptoms as described above.  Patient afebrile but hypotensive frail and weak appearing.  Blood work will be sent for above differential.  Will give very small aliquots of IV fluids for resuscitation.  Patient denies any pain but does feel weak whenever he stands up.  Based on his presentation history and recent medication changes I do suspect some component of overdiuresis.  Possible worsening cardiac output.  The patient will be placed on continuous pulse oximetry and telemetry for monitoring.  Laboratory evaluation will be sent to evaluate for the above complaints.     Clinical Course as of Oct 10 1235  Mon Oct 09, 2017  1223 Lactate is normal.  Does show mild renal insufficiency with mild hyperkalemia.  Troponin roughly at baseline.  He is afebrile.  Based on his presentation I do suspect component of overdiuresis and dehydration with hypovolemia.  Patient's complex gated presentation by his profound depressed EF with severe mitral valve regurgitation.  Discussed case with hospitalist group who kindly agrees to admit patient for gentle IV hydration and further medical management.   [PR]    Clinical Course User Index [PR] Willy Eddy, MD     As part of my medical decision making, I reviewed the following data within the electronic MEDICAL RECORD NUMBER Nursing notes reviewed and incorporated, Labs reviewed, notes from prior ED visits.  ____________________________________________   FINAL CLINICAL IMPRESSION(S) / ED DIAGNOSES  Final diagnoses:   Hypotension, unspecified hypotension type  Chronic congestive heart failure, unspecified heart failure type (HCC)  Weakness      NEW MEDICATIONS STARTED DURING THIS VISIT:  New Prescriptions   No medications on file     Note:  This document was prepared using Dragon voice recognition software and may include unintentional dictation errors.    Willy Eddy, MD 10/09/17 1239

## 2017-10-10 LAB — BASIC METABOLIC PANEL
Anion gap: 4 — ABNORMAL LOW (ref 5–15)
Anion gap: 5 (ref 5–15)
BUN: 34 mg/dL — ABNORMAL HIGH (ref 8–23)
BUN: 33 mg/dL — ABNORMAL HIGH (ref 8–23)
CO2: 23 mmol/L (ref 22–32)
CO2: 24 mmol/L (ref 22–32)
CREATININE: 1.35 mg/dL — AB (ref 0.61–1.24)
Calcium: 8.7 mg/dL — ABNORMAL LOW (ref 8.9–10.3)
Calcium: 9.2 mg/dL (ref 8.9–10.3)
Chloride: 106 mmol/L (ref 98–111)
Chloride: 106 mmol/L (ref 98–111)
Creatinine, Ser: 1.42 mg/dL — ABNORMAL HIGH (ref 0.61–1.24)
GFR calc Af Amer: 52 mL/min — ABNORMAL LOW (ref >60)
GFR calc Af Amer: 49 mL/min — ABNORMAL LOW (ref >60)
GFR calc non Af Amer: 45 mL/min — ABNORMAL LOW (ref >60)
GFR calc non Af Amer: 43 mL/min — ABNORMAL LOW (ref >60)
Glucose, Bld: 84 mg/dL (ref 70–99)
Glucose, Bld: 80 mg/dL (ref 70–99)
POTASSIUM: 5.2 mmol/L — AB (ref 3.5–5.1)
Potassium: 6.4 mmol/L (ref 3.5–5.1)
Sodium: 133 mmol/L — ABNORMAL LOW (ref 135–145)
Sodium: 135 mmol/L (ref 135–145)

## 2017-10-10 LAB — CBC
HCT: 35.1 % — ABNORMAL LOW (ref 40.0–52.0)
Hemoglobin: 12.1 g/dL — ABNORMAL LOW (ref 13.0–18.0)
MCH: 31.5 pg (ref 26.0–34.0)
MCHC: 34.3 g/dL (ref 32.0–36.0)
MCV: 91.7 fL (ref 80.0–100.0)
Platelets: 169 10*3/uL (ref 150–440)
RBC: 3.83 MIL/uL — ABNORMAL LOW (ref 4.40–5.90)
RDW: 12.7 % (ref 11.5–14.5)
WBC: 3.1 10*3/uL — ABNORMAL LOW (ref 3.8–10.6)

## 2017-10-10 LAB — NA AND K (SODIUM & POTASSIUM), RAND UR
Potassium Urine: 21 mmol/L
Sodium, Ur: 107 mmol/L

## 2017-10-10 LAB — MAGNESIUM: Magnesium: 2.2 mg/dL (ref 1.7–2.4)

## 2017-10-10 LAB — CREATININE, URINE, RANDOM: Creatinine, Urine: 37 mg/dL

## 2017-10-10 MED ORDER — INSULIN ASPART 100 UNIT/ML IV SOLN: 10.0000 [IU] | Freq: Once | INTRAVENOUS | Status: DC

## 2017-10-10 MED ORDER — FUROSEMIDE 10 MG/ML IJ SOLN
40.0000 mg | Freq: Once | INTRAMUSCULAR | Status: AC
  Administered 2017-10-10: 40 mg via INTRAVENOUS
  Filled 2017-10-10: qty 4

## 2017-10-10 MED ORDER — ADULT MULTIVITAMIN W/MINERALS CH
1.0000 | ORAL_TABLET | Freq: Every day | ORAL | Status: DC
  Administered 2017-10-10 – 2017-10-13 (×4): 1 via ORAL
  Filled 2017-10-10 (×4): qty 1

## 2017-10-10 MED ORDER — INSULIN REGULAR HUMAN 100 UNIT/ML IJ SOLN
10.0000 [IU] | Freq: Once | INTRAMUSCULAR | Status: AC
  Administered 2017-10-10: 10 [IU] via INTRAVENOUS
  Filled 2017-10-10: qty 0.1

## 2017-10-10 MED ORDER — SODIUM CHLORIDE 0.9 % IV BOLUS
500.0000 mL | Freq: Once | INTRAVENOUS | Status: AC
  Administered 2017-10-10: 500 mL via INTRAVENOUS

## 2017-10-10 MED ORDER — DEXTROSE 50 % IV SOLN
1.0000 | Freq: Once | INTRAVENOUS | Status: AC
  Administered 2017-10-10: 50 mL via INTRAVENOUS
  Filled 2017-10-10: qty 50

## 2017-10-10 MED ORDER — ENSURE ENLIVE PO LIQD
237.0000 mL | Freq: Two times a day (BID) | ORAL | Status: DC
  Administered 2017-10-10 – 2017-10-12 (×4): 237 mL via ORAL

## 2017-10-10 MED ORDER — SODIUM CHLORIDE 0.9% FLUSH
3.0000 mL | Freq: Two times a day (BID) | INTRAVENOUS | Status: DC
  Administered 2017-10-10 – 2017-10-13 (×6): 3 mL via INTRAVENOUS

## 2017-10-10 NOTE — Progress Notes (Signed)
[facility] [place] [facility]   PATIENT NAME: Erik Randall    MR#:  [id number]  DATE OF BIRTH:  1926/08/08  SUBJECTIVE:  CHIEF COMPLAINT: Patient denies any complaints.  Denies any dizziness.    REVIEW OF SYSTEMS:  CONSTITUTIONAL: No fever, fatigue or weakness.  EYES: No blurred or double vision.  EARS, NOSE, AND THROAT: No tinnitus or ear pain.  RESPIRATORY: No cough, shortness of breath, wheezing or hemoptysis.  CARDIOVASCULAR: No chest pain, orthopnea, edema.  GASTROINTESTINAL: No nausea, vomiting, diarrhea or abdominal pain.  GENITOURINARY: No dysuria, hematuria.  ENDOCRINE: No polyuria, nocturia,  HEMATOLOGY: No anemia, easy bruising or bleeding SKIN: No rash or lesion. MUSCULOSKELETAL: No joint pain or arthritis.   NEUROLOGIC: No tingling, numbness, weakness.  PSYCHIATRY: No anxiety or depression.   DRUG ALLERGIES:  No Known Allergies  VITALS:  Blood pressure (!) 82/62, pulse 64, temperature 98.6 F (37 C), temperature source Oral, resp. rate 18, height 6\' 2"  (1.88 m), weight 61.6 kg (135 lb 14.4 oz), SpO2 99 %.  PHYSICAL EXAMINATION:  GENERAL:  84 y.o.-year-old patient lying in the bed with no acute distress.  EYES: Pupils equal, round, reactive to light and accommodation. No scleral icterus. Extraocular muscles intact.  HEENT: Head atraumatic, normocephalic. Oropharynx and nasopharynx clear.  NECK:  Supple, no jugular venous distention. No thyroid enlargement, no tenderness.  LUNGS: Normal breath sounds bilaterally, no wheezing, rales,rhonchi or crepitation. No use of accessory muscles of respiration.  CARDIOVASCULAR: S1, S2 normal. No murmurs, rubs, or gallops.  ABDOMEN: Soft, nontender, nondistended. Bowel sounds present. No organomegaly or mass.  EXTREMITIES: No pedal edema, cyanosis, or clubbing.  NEUROLOGIC: Cranial nerves II through XII are intact. Muscle strength 5/5 in all extremities. Sensation intact. Gait not checked.   PSYCHIATRIC: The patient is alert and oriented x 3.  SKIN: No obvious rash, lesion, or ulcer.    LABORATORY PANEL:   CBC Recent Labs  Lab 10/10/17 0450  WBC 3.1*  HGB 12.1*  HCT 35.1*  PLT 169   ------------------------------------------------------------------------------------------------------------------  Chemistries  Recent Labs  Lab 10/09/17 1038  10/10/17 0917  NA 133*   < > 135  K 5.4*   < > 5.2*  CL 101   < > 106  CO2 24   < > 24  GLUCOSE 87   < > 80  BUN 37*   < > 33*  CREATININE 1.45*   < > 1.42*  CALCIUM 9.6   < > 9.2  MG  --   --  2.2  AST 26  --   --   ALT 18  --   --   ALKPHOS 85  --   --   BILITOT 1.2  --   --    < > = values in this interval not displayed.   ------------------------------------------------------------------------------------------------------------------  Cardiac Enzymes Recent Labs  Lab 10/09/17 2243  TROPONINI 0.06*   ------------------------------------------------------------------------------------------------------------------  RADIOLOGY:  Dg Chest Port 1 View  Result Date: 10/09/2017 CLINICAL DATA:  Hypertension. EXAM: PORTABLE CHEST 1 VIEW COMPARISON:  Radiographs of September 17, 2017. FINDINGS: Stable cardiomediastinal silhouette. No pneumothorax is noted. Emphysematous disease is noted in both upper lobes. Stable bibasilar subsegmental atelectasis or scarring is noted. Bony thorax is unremarkable. IMPRESSION: Stable bibasilar subsegmental atelectasis or scarring. Emphysema (ICD10-J43.9). Electronically Signed   By: Lupita Raider, M.D.   On: 10/09/2017 10:58    EKG:   Orders placed or performed during the hospital encounter of 10/09/17  .  EKG 12-Lead  . EKG 12-Lead  . EKG 12-Lead  . EKG 12-Lead  . EKG 12-Lead  . EKG 12-Lead    ASSESSMENT AND PLAN:   Hypotension.  Recently treated with aggressive diuretics for CHF exacerbation PT consult Continue gentle hydration with IV fluids and watch for symptoms and signs  of fluid overload  Hold diuretics and Entresto due to hypotension.    Acute renal failure.   Avoid nephrotoxins hold diuretics and Entresto  gentle normal saline IV, follow-up BMP. Creatinine 1.71-1.45  Hyponatremia and hyperkalemia.  Normal saline IV, hold Entresto and follow-up BMP.  Hyperkalemia almost resolved.  Sodium at 135  Elevated troponin due to above.  Follow-up troponin level.  Chronic systolic CHF with ejection fraction 10%.  Hold diuretics and Entresto, cardiology consult to adjust medication.  Severe protein calorie malnutrition-dietitian is following.  Provide dietary supplements  Failure to thrive palliative care consulted scheduled for meeting tomorrow     All the records are reviewed and case discussed with Care Management/Social Workerr. Management plans discussed with the patient, family and they are in agreement.  CODE STATUS: fc  TOTAL TIME TAKING CARE OF THIS PATIENT: 35 minutes.   POSSIBLE D/C IN 1-2 DAYS, DEPENDING ON CLINICAL CONDITION.  Note: This dictation was prepared with Dragon dictation along with smaller phrase technology. Any transcriptional errors that result from this process are unintentional.   Ramonita LabAruna Breland Elders M.D on 10/10/2017 at 8:41 PM  Between 7am to 6pm - Pager - [phone] After 6pm go to [email or web address] - password [email or web address] [facility]  [facility]  Office  [phone]  CC: Primary care physician; Dorothey BasemanBronstein, David, MD

## 2017-10-10 NOTE — Progress Notes (Signed)
Spoke to Dr. Darrold JunkerParaschos regarding patient's very low B.P. and continuation of fluid order. No new orders. Unless symptomatic informing physician of low B.P.'s is not needed at this time. Will continue to closely monitor. Erik Randall [facility]

## 2017-10-10 NOTE — Progress Notes (Signed)
[facility]  SUBJECTIVE: The patient reports feeling better this morning, less weakness. Denies chest pain, palpitations, or shortness of breath.   Vitals:   10/09/17 1659 10/09/17 1954 10/10/17 0347 10/10/17 0349  BP: 107/85 95/75 (!) 72/54 (!) 83/65  Pulse: (!) 52 (!) 57 (!) 55 (!) 50  Resp:  18 18   Temp:  (!) 97.5 F (36.4 C) 97.7 F (36.5 C)   TempSrc:   Oral   SpO2: 90%  95%   Weight:      Height:         Intake/Output Summary (Last 24 hours) at 10/10/2017 0825 Last data filed at 10/10/2017 1478 Gross per 24 hour  Intake 1250 ml  Output 1300 ml  Net -50 ml      PHYSICAL EXAM  General: Frail, thin, elderly gentleman, sitting on side of bed, in no acute distress. Pleasant. HEENT:  Normocephalic and atramatic Neck:  No JVD.  Lungs: Clear bilaterally to auscultation, normal effort of breathing on room air. Heart: HRRR . 2/6 systolic murmur  Abdomen: Bowel sounds are positive, abdomen soft Msk:  Back normal, gait not assessed. Normal strength and tone for age. Extremities: No clubbing, cyanosis or edema.   Neuro: Alert and oriented X 3. Psych:  Good affect, responds appropriately   LABS: Basic Metabolic Panel: Recent Labs    10/09/17 1038 10/10/17 0450  NA 133* 133*  K 5.4* 6.4*  CL 101 106  CO2 24 23  GLUCOSE 87 84  BUN 37* 34*  CREATININE 1.45* 1.35*  CALCIUM 9.6 8.7*   Liver Function Tests: Recent Labs    10/09/17 1038  AST 26  ALT 18  ALKPHOS 85  BILITOT 1.2  PROT 8.2*  ALBUMIN 4.3   No results for input(s): LIPASE, AMYLASE in the last 72 hours. CBC: Recent Labs    10/09/17 1038 10/10/17 0450  WBC 3.0* 3.1*  NEUTROABS 1.6  --   HGB 14.0 12.1*  HCT 41.8 35.1*  MCV 92.5 91.7  PLT 192 169   Cardiac Enzymes: Recent Labs    10/09/17 1038 10/09/17 1630 10/09/17 2243  TROPONINI 0.06* 0.05* 0.06*   BNP: Invalid input(s): POCBNP D-Dimer: No results for input(s): DDIMER in the last 72 hours. Hemoglobin A1C: No results for  input(s): HGBA1C in the last 72 hours. Fasting Lipid Panel: No results for input(s): CHOL, HDL, LDLCALC, TRIG, CHOLHDL, LDLDIRECT in the last 72 hours. Thyroid Function Tests: No results for input(s): TSH, T4TOTAL, T3FREE, THYROIDAB in the last 72 hours.  Invalid input(s): FREET3 Anemia Panel: No results for input(s): VITAMINB12, FOLATE, FERRITIN, TIBC, IRON, RETICCTPCT in the last 72 hours.  Dg Chest Port 1 View  Result Date: 10/09/2017 CLINICAL DATA:  Hypertension. EXAM: PORTABLE CHEST 1 VIEW COMPARISON:  Radiographs of September 17, 2017. FINDINGS: Stable cardiomediastinal silhouette. No pneumothorax is noted. Emphysematous disease is noted in both upper lobes. Stable bibasilar subsegmental atelectasis or scarring is noted. Bony thorax is unremarkable. IMPRESSION: Stable bibasilar subsegmental atelectasis or scarring. Emphysema (ICD10-J43.9). Electronically Signed   By: Lupita Raider, M.D.   On: 10/09/2017 10:58     Echo: LV EF 10%, severe MR  TELEMETRY: sinus rhythm, PVCs, rate 70s  ASSESSMENT AND PLAN:  Active Problems:   Hypotension    1. Hypotension, which presented with weakness, which is now improved. Still hypotensive. Normal mentation, minimally symptomatic. All BP and CHF meds being held at this time. 2. Hyperkalemia, K 6.4 this morning. Serum creatinine 1.35. Urine creatinine 37, urine Na  107, urine K 21 3. Chronic systolic congestive heart failure, recently treated for exacerbation with IV diuretics, now hypovolemic. 4. Severe mitral regurgitation  Plan: 1. Agree with overall therapy 2. Recheck BMP this morning 3. Defer CRT-D due to patient's advanced age and comorbidities 4. Avoid overhydrating to prevent CHF exacerbation.    Leanora Ivanoffnna Macdonald Rigor, PA-C 10/10/2017 8:25 AM

## 2017-10-10 NOTE — Care Management Obs Status (Signed)
MEDICARE OBSERVATION STATUS NOTIFICATION   Patient Details  Name: Erik Randall MRN: [id number] Date of Birth: Aug 07, 1926   Medicare Observation Status Notification Given:  Yes    Erik HongGreene, Jaimes Eckert R, RN 10/10/2017, 3:53 PM

## 2017-10-10 NOTE — Progress Notes (Signed)
No charge note:   Consult received. Meeting scheduled for 7.31 at 0930.  Ocie BobKasie Mahan, AGNP-C Palliative Medicine  Please call Palliative Medicine team phone with any questions [phone]. For individual providers please see AMION.

## 2017-10-10 NOTE — Care Management (Addendum)
Patient placed in observation from home for hypotension.  Patient's Entresto and diuretics on hold.  had a potassium of 6.4 earlier today.  Received IV D%) and IVF bolus.  Cardiology consulting in regards to medication adjustment.  Palliative consult pending.  Meeting scheduled for 7/31 0930A. Sent notice to UR regarding clinical status

## 2017-10-10 NOTE — Progress Notes (Signed)
Critical lab value of 6.4 potassium reported. MD Sheryle Hailiamond made aware. New orders for STAT EKG, D 50%  50ml, 10 units IV insulin, 500 bolus, and 40 mg lasix ordered. UA for creatinine and K+/Na levels collected. Pt educated on medications and rationales and verbalized understanding.

## 2017-10-10 NOTE — Progress Notes (Signed)
Initial Nutrition Assessment  DOCUMENTATION CODES:   Severe malnutrition in context of chronic illness  INTERVENTION:   Ensure Enlive po BID, each supplement provides 350 kcal and 20 grams of protein  Magic cup TID with meals, each supplement provides 290 kcal and 9 grams of protein  MVI daily  NUTRITION DIAGNOSIS:   Severe Malnutrition related to chronic illness(CHF, advanced age) as evidenced by severe fat depletion, severe muscle depletion.  GOAL:   Patient will meet greater than or equal to 90% of their needs  MONITOR:   PO intake, Supplement acceptance, Labs, Weight trends, I & O's, Skin  REASON FOR ASSESSMENT:   Other (Comment)(low BMI)    ASSESSMENT:   84 y.o. male with a known history of hypertension and chronic systolic CHF with ejection fraction 10%.  He was just recently admitted for fluid overload and treated with diuretic admitted with hypotension    Met with pt and pt's wife in room today. Pt reports good appetite and oral intake pta; pt continues to have good appetite today and eating 100% of meals. Pt does not drink any supplements at home but is willing to try chocolate or strawberry Ensure. Pt loves ice cream. Per chart, pt has lost 14lbs(10%) over the past 7 months; this is significant given the fact that pt is already severely malnourished. RD will add supplements and MVI to help pt meet his estimated needs.   Medications reviewed and include: aspirin, heparin, Mg oxide, NaCl @50ml /hr  Labs reviewed: K 5.2(H), BUN 33(H), creat 1.42(H) Wbc- 3.1(L), Hgb 12.1(L), Hct 35.1(L)  NUTRITION - FOCUSED PHYSICAL EXAM:    Most Recent Value  Orbital Region  Severe depletion  Upper Arm Region  Severe depletion  Thoracic and Lumbar Region  Severe depletion  Buccal Region  Severe depletion  Temple Region  Severe depletion  Clavicle Bone Region  Severe depletion  Clavicle and Acromion Bone Region  Severe depletion  Scapular Bone Region  Severe depletion  Dorsal  Hand  Severe depletion  Patellar Region  Severe depletion  Anterior Thigh Region  Severe depletion  Posterior Calf Region  Severe depletion  Edema (RD Assessment)  None  Hair  Reviewed  Eyes  Reviewed  Mouth  Reviewed  Skin  Reviewed  Nails  Reviewed     Diet Order:   Diet Order           Diet 2 gram sodium Room service appropriate? Yes; Fluid consistency: Thin  Diet effective now         EDUCATION NEEDS:   Education needs have been addressed  Skin:  Skin Assessment: Reviewed RN Assessment  Last BM:  7/29  Height:   Ht Readings from Last 1 Encounters:  10/09/17 6' 2"  (1.88 m)    Weight:   Wt Readings from Last 1 Encounters:  10/09/17 136 lb (61.7 kg)    Ideal Body Weight:  86.3 kg  BMI:  Body mass index is 17.46 kg/m.  Estimated Nutritional Needs:   Kcal:  1800-2100kcal/day   Protein:  93-105g/day   Fluid:  1.5L/day or per MD  Koleen Distance MS, RD, LDN Pager #- [phone] Office#- [phone] After Hours Pager: [phone]

## 2017-10-10 NOTE — Plan of Care (Signed)
  Problem: Clinical Measurements: Goal: Ability to maintain clinical measurements within normal limits will improve Outcome: Not Progressing Note:  Patient's B.P. remains very low this AM at only 80 systolic. Will discuss with physician renewing continuous fluid order. [facility]

## 2017-10-11 DIAGNOSIS — Z515 Encounter for palliative care: Secondary | ICD-10-CM | POA: Diagnosis not present

## 2017-10-11 DIAGNOSIS — R531 Weakness: Secondary | ICD-10-CM | POA: Diagnosis present

## 2017-10-11 DIAGNOSIS — Z7982 Long term (current) use of aspirin: Secondary | ICD-10-CM | POA: Diagnosis not present

## 2017-10-11 DIAGNOSIS — I959 Hypotension, unspecified: Secondary | ICD-10-CM | POA: Diagnosis present

## 2017-10-11 DIAGNOSIS — Z7189 Other specified counseling: Secondary | ICD-10-CM | POA: Diagnosis not present

## 2017-10-11 DIAGNOSIS — I447 Left bundle-branch block, unspecified: Secondary | ICD-10-CM | POA: Diagnosis present

## 2017-10-11 DIAGNOSIS — E43 Unspecified severe protein-calorie malnutrition: Secondary | ICD-10-CM

## 2017-10-11 DIAGNOSIS — I110 Hypertensive heart disease with heart failure: Secondary | ICD-10-CM | POA: Diagnosis present

## 2017-10-11 DIAGNOSIS — Z8701 Personal history of pneumonia (recurrent): Secondary | ICD-10-CM | POA: Diagnosis not present

## 2017-10-11 DIAGNOSIS — Z8249 Family history of ischemic heart disease and other diseases of the circulatory system: Secondary | ICD-10-CM | POA: Diagnosis not present

## 2017-10-11 DIAGNOSIS — I083 Combined rheumatic disorders of mitral, aortic and tricuspid valves: Secondary | ICD-10-CM | POA: Diagnosis present

## 2017-10-11 DIAGNOSIS — E861 Hypovolemia: Secondary | ICD-10-CM | POA: Diagnosis present

## 2017-10-11 DIAGNOSIS — E875 Hyperkalemia: Secondary | ICD-10-CM | POA: Diagnosis present

## 2017-10-11 DIAGNOSIS — Z87891 Personal history of nicotine dependence: Secondary | ICD-10-CM | POA: Diagnosis not present

## 2017-10-11 DIAGNOSIS — I509 Heart failure, unspecified: Secondary | ICD-10-CM | POA: Diagnosis not present

## 2017-10-11 DIAGNOSIS — I255 Ischemic cardiomyopathy: Secondary | ICD-10-CM | POA: Diagnosis present

## 2017-10-11 DIAGNOSIS — E871 Hypo-osmolality and hyponatremia: Secondary | ICD-10-CM | POA: Diagnosis present

## 2017-10-11 DIAGNOSIS — Z79899 Other long term (current) drug therapy: Secondary | ICD-10-CM | POA: Diagnosis not present

## 2017-10-11 DIAGNOSIS — I2510 Atherosclerotic heart disease of native coronary artery without angina pectoris: Secondary | ICD-10-CM | POA: Diagnosis present

## 2017-10-11 DIAGNOSIS — Z66 Do not resuscitate: Secondary | ICD-10-CM | POA: Diagnosis not present

## 2017-10-11 DIAGNOSIS — Z681 Body mass index (BMI) 19 or less, adult: Secondary | ICD-10-CM | POA: Diagnosis not present

## 2017-10-11 DIAGNOSIS — R627 Adult failure to thrive: Secondary | ICD-10-CM | POA: Diagnosis present

## 2017-10-11 DIAGNOSIS — I5022 Chronic systolic (congestive) heart failure: Secondary | ICD-10-CM | POA: Diagnosis present

## 2017-10-11 DIAGNOSIS — N179 Acute kidney failure, unspecified: Secondary | ICD-10-CM | POA: Diagnosis present

## 2017-10-11 LAB — BASIC METABOLIC PANEL
Anion gap: 5 (ref 5–15)
BUN: 41 mg/dL — AB (ref 8–23)
CHLORIDE: 106 mmol/L (ref 98–111)
CO2: 23 mmol/L (ref 22–32)
CREATININE: 1.34 mg/dL — AB (ref 0.61–1.24)
Calcium: 8.9 mg/dL (ref 8.9–10.3)
GFR calc Af Amer: 53 mL/min — ABNORMAL LOW (ref >60)
GFR, EST NON AFRICAN AMERICAN: 46 mL/min — AB (ref >60)
Glucose, Bld: 89 mg/dL (ref 70–99)
Potassium: 6.1 mmol/L — ABNORMAL HIGH (ref 3.5–5.1)
SODIUM: 134 mmol/L — AB (ref 135–145)

## 2017-10-11 LAB — CBC
HCT: 35.8 % — ABNORMAL LOW (ref 40.0–52.0)
Hemoglobin: 12.1 g/dL — ABNORMAL LOW (ref 13.0–18.0)
MCH: 31 pg (ref 26.0–34.0)
MCHC: 33.8 g/dL (ref 32.0–36.0)
MCV: 91.7 fL (ref 80.0–100.0)
Platelets: 168 10*3/uL (ref 150–440)
RBC: 3.9 MIL/uL — ABNORMAL LOW (ref 4.40–5.90)
RDW: 12.7 % (ref 11.5–14.5)
WBC: 3.4 10*3/uL — AB (ref 3.8–10.6)

## 2017-10-11 LAB — POTASSIUM: Potassium: 5.8 mmol/L — ABNORMAL HIGH (ref 3.5–5.1)

## 2017-10-11 MED ORDER — DIPHENHYDRAMINE-ZINC ACETATE 2-0.1 % EX CREA
TOPICAL_CREAM | Freq: Three times a day (TID) | CUTANEOUS | Status: DC | PRN
  Filled 2017-10-11 (×2): qty 28

## 2017-10-11 MED ORDER — SODIUM POLYSTYRENE SULFONATE 15 GM/60ML PO SUSP
30.0000 g | Freq: Once | ORAL | Status: AC
  Administered 2017-10-11: 30 g via ORAL
  Filled 2017-10-11: qty 120

## 2017-10-11 NOTE — Progress Notes (Signed)
[facility] - [place] at [facility]   PATIENT NAME: Erik Randall    MR#:  [id number]  DATE OF BIRTH:  May 14, 1926  SUBJECTIVE:  CHIEF COMPLAINT: Patient denies any complaints.  Denies any dizziness.   Wife Erik Randall at bedside.  REVIEW OF SYSTEMS:  CONSTITUTIONAL: No fever, fatigue or weakness.  EYES: No blurred or double vision.  EARS, NOSE, AND THROAT: No tinnitus or ear pain.  RESPIRATORY: No cough, shortness of breath, wheezing or hemoptysis.  CARDIOVASCULAR: No chest pain, orthopnea, edema.  GASTROINTESTINAL: No nausea, vomiting, diarrhea or abdominal pain.  GENITOURINARY: No dysuria, hematuria.  ENDOCRINE: No polyuria, nocturia,  HEMATOLOGY: No anemia, easy bruising or bleeding SKIN: No rash or lesion. MUSCULOSKELETAL: No joint pain or arthritis.   NEUROLOGIC: No tingling, numbness, weakness.  PSYCHIATRY: No anxiety or depression.   DRUG ALLERGIES:  No Known Allergies  VITALS:  Blood pressure (!) 94/50, pulse (!) 56, temperature 98.2 F (36.8 C), temperature source Oral, resp. rate 18, height 6\' 2"  (1.88 m), weight 59.2 kg (130 lb 8 oz), SpO2 97 %.  PHYSICAL EXAMINATION:  GENERAL:  84 y.o.-year-old patient lying in the bed with no acute distress.  EYES: Pupils equal, round, reactive to light and accommodation. No scleral icterus. Extraocular muscles intact.  HEENT: Head atraumatic, normocephalic. Oropharynx and nasopharynx clear.  NECK:  Supple, no jugular venous distention. No thyroid enlargement, no tenderness.  LUNGS: Normal breath sounds bilaterally, no wheezing, rales,rhonchi or crepitation. No use of accessory muscles of respiration.  CARDIOVASCULAR: S1, S2 normal. No murmurs, rubs, or gallops.  ABDOMEN: Soft, nontender, nondistended. Bowel sounds present. No organomegaly or mass.  EXTREMITIES: No pedal edema, cyanosis, or clubbing.  NEUROLOGIC: Cranial nerves II through XII are intact. Muscle strength 5/5 in all extremities. Sensation  intact. Gait not checked.  PSYCHIATRIC: The patient is alert and oriented x 3.  SKIN: No obvious rash, lesion, or ulcer.    LABORATORY PANEL:   CBC Recent Labs  Lab 10/11/17 0421  WBC 3.4*  HGB 12.1*  HCT 35.8*  PLT 168   ------------------------------------------------------------------------------------------------------------------  Chemistries  Recent Labs  Lab 10/09/17 1038  10/10/17 0917 10/11/17 0421  NA 133*   < > 135 134*  K 5.4*   < > 5.2* 6.1*  CL 101   < > 106 106  CO2 24   < > 24 23  GLUCOSE 87   < > 80 89  BUN 37*   < > 33* 41*  CREATININE 1.45*   < > 1.42* 1.34*  CALCIUM 9.6   < > 9.2 8.9  MG  --   --  2.2  --   AST 26  --   --   --   ALT 18  --   --   --   ALKPHOS 85  --   --   --   BILITOT 1.2  --   --   --    < > = values in this interval not displayed.   ------------------------------------------------------------------------------------------------------------------  Cardiac Enzymes Recent Labs  Lab 10/09/17 2243  TROPONINI 0.06*   ------------------------------------------------------------------------------------------------------------------  RADIOLOGY:  No results found.  EKG:   Orders placed or performed during the hospital encounter of 10/09/17  . EKG 12-Lead  . EKG 12-Lead  . EKG 12-Lead  . EKG 12-Lead  . EKG 12-Lead  . EKG 12-Lead    ASSESSMENT AND PLAN:   Hypotension.  Recently treated with aggressive diuretics for CHF exacerbation Blood pressure improved.  Not  orthostatic  PT consult Provided gentle hydration with IV fluids  Hold diuretics and Entresto due to hypotension.   KC cards follwing  Acute renal failure.   Avoid nephrotoxins hold diuretics and Entresto  gentle normal saline IV, follow-up BMP. Creatinine 1.71-1.45-1.34  Hyponatremia -improved with IV fluids sodium at 134  Hyperkalemia-unclear etiology  Patient is not on ACE inhibitor.  Entresto discontinued  Potassium 6.4-5.2-6.1  Kayexalate  given and repeat potassium   elevated troponin due to above.    Non-trending troponins.  0.05-0.06 and patient is a symptomatic  Chronic systolic CHF with ejection fraction 10%.  Hold diuretics and Entresto, cardiology consult to adjust medication.  Severe protein calorie malnutrition-dietitian is following.  Provide dietary supplements  Failure to thrive palliative care consulted, had a family meeting today.  CODE STATUS changed to DO NOT RESUSCITATE.  Outpatient palliative care follow-up     All the records are reviewed and case discussed with Care Management/Social Workerr. Management plans discussed with the patient, wife Erik Randall at bedside and they are in agreement.  CODE STATUS: fc  TOTAL TIME TAKING CARE OF THIS PATIENT: 82 minutes.   POSSIBLE D/C IN 1 DAYS, DEPENDING ON CLINICAL CONDITION.  Note: This dictation was prepared with Dragon dictation along with smaller phrase technology. Any transcriptional errors that result from this process are unintentional.   Ramonita LabAruna Kartel Wolbert M.D on 10/11/2017 at 1:27 PM  Between 7am to 6pm - Pager - [phone] After 6pm go to [email or web address] - password [email or web address] [email or web address]  [facility]  Office  [phone]  CC: Primary care physician; Dorothey BasemanBronstein, David, MD

## 2017-10-11 NOTE — Care Management (Signed)
Patient is agreeable to have home health nurse to follow up with heart failure management.  He has access to scales.  Agency preference is [facility]. Referral called and accepted.

## 2017-10-11 NOTE — Progress Notes (Addendum)
Consultation Note Date: 10/11/2017   Patient Name: Erik Randall  DOB: 09-Feb-1927  MRN: [id number]  Age / Sex: 84 y.o., male  PCP: Erik Pitch, MD Referring Physician: Nicholes Mango, MD  Reason for Consultation: Establishing goals of care  HPI/Patient Profile: 84 y.o. male  with past medical history of CHF (EF 10% MV regurgitation, cardiomyopathy), CAD, HTN, admitted on 10/09/2017 with hypotension. Patient was admitted early this month with pneumonia. Palliative medicine consulted for Erik Randall.    Clinical Assessment and Goals of Care:  I have reviewed medical records including EPIC notes, labs and imaging, assessed the patient and then met at the bedside along with the patient and his spouse to discuss diagnosis prognosis, GOC, EOL wishes, disposition and options.  I introduced Palliative Medicine as specialized medical care for people living with serious illness. It focuses on providing relief from the symptoms and stress of a serious illness. The goal is to improve quality of life for both the patient and the family.  We discussed a brief life review of the patient. He has lived many places- his favorite being [place], [place]. He met his wife of 48 years there. 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 He enjoys traveling. He drives to [place], [place], [place] frequently to visit friends.   As far as functional and nutritional status- he is independent with ADL's. He reports no limiting symptoms of SOB. States he has been active until his admission in the beginning of this month for pneumonia. He had just returned from traveling prior to that admission. He does not stay at home. He prefers to be on the go. He has a good appetite but has lost a significant amount of weight. He notes he has lost about 30 pounds in the last year. The reason is unclear and this bothers him.     We discussed their  current illness and what it means in the larger context of their on-going co-morbidities.  Natural disease trajectory and expectations at EOL were discussed.  I attempted to elicit values and goals of care important to the patient. Erik Randall values medical care, but knows his time is limited. He would not want interventions "that wouldn't help me get better", however, he does not feel he is at end of life. He has several siblings who have had heart failure similar to him and have lived into their late nineties and he feels he will be the same.    Patient and wife value honesty from their providers and ask for information to be provided directly. They express some feeling that information is being withheld from them. They note that they were unaware that patient had signs of emphysema on his last chest xrays.   The difference between aggressive medical intervention and comfort care was considered in light of the patient's goals of care.   Advanced directives, concepts specific to code status, artifical feeding and hydration, and rehospitalization were considered and discussed. Erik Randall is clear that he does not want to go  to CPR or be on a ventilator. We discussed a DNR order and he agrees this best aligns with his [place].   Hospice and Palliative Care services outpatient were explained and offered. Patient and spouse agree to Palliative service f/u after discharge.   Questions and concerns were addressed.  Hard Choices booklet left for review. The family was encouraged to call with questions or concerns.   Primary Decision Maker PATIENT    SUMMARY OF RECOMMENDATIONS -Full scope care -DNR -Palliative outpatient at home -Weight loss- patient reports good appetite, but loss of interest in food with low sodium restrictions- worrisome for underlying pathology- he reports ongoing back pain- ?back xray/imaging for further workup? -Stop hydroxyzine (patient has been taking nightly prior to and  during this admission- ?could be adding to hypotension?) -Will add benadryl topical for itching  -Patient and family concerned as to cause of hypokalemia, would like more information regarding this  Code Status/Advance Care Planning:  DNR  Additional Recommendations (Limitations, Scope, Preferences):  Full Scope Treatment  Prognosis:    Unable to determine  Discharge Planning: Home with Palliative Services  Primary Diagnoses: Present on Admission: . Hypotension   I have reviewed the medical record, interviewed the patient and family, and examined the patient. The following aspects are pertinent.  Past Medical History:  Diagnosis Date  . CHF (congestive heart failure) ([place])   . Hypertension    Social History   Socioeconomic History  . Marital status: Married    Spouse name: Not on file  . Number of children: Not on file  . Years of education: Not on file  . Highest education level: Not on file  Occupational History  . Not on file  Social Needs  . Financial resource strain: Not on file  . Food insecurity:    Worry: Not on file    Inability: Not on file  . Transportation needs:    Medical: Not on file    Non-medical: Not on file  Tobacco Use  . Smoking status: Former Research scientist (life sciences)  . Smokeless tobacco: Never Used  Substance and Sexual Activity  . Alcohol use: Never    Frequency: Never  . Drug use: Never  . Sexual activity: Not on file  Lifestyle  . Physical activity:    Days per week: Not on file    Minutes per session: Not on file  . Stress: Not on file  Relationships  . Social connections:    Talks on phone: Not on file    Gets together: Not on file    Attends religious service: Not on file    Active member of club or organization: Not on file    Attends meetings of clubs or organizations: Not on file    Relationship status: Not on file  Other Topics Concern  . Not on file  Social History Narrative  . Not on file   Family History  Problem Relation  Age of Onset  . Stroke Mother   . Hypertension Father   . Heart attack Father   . Hypertension Sister    Scheduled Meds: . aspirin EC  81 mg Oral Once per day on Mon Thu  . feeding supplement (ENSURE ENLIVE)  237 mL Oral BID BM  . heparin  5,000 Units Subcutaneous Q8H  . magnesium oxide  400 mg Oral Daily  . multivitamin with minerals  1 tablet Oral Daily  . sodium chloride flush  3 mL Intravenous Q12H   Continuous Infusions: PRN Meds:.acetaminophen **OR** acetaminophen, albuterol,  diphenhydrAMINE-zinc acetate, ondansetron **OR** ondansetron (ZOFRAN) IV, senna-docusate Medications Prior to Admission:  Prior to Admission medications   Medication Sig Start Date End Date Taking? Authorizing Provider  aspirin EC 81 MG tablet Take 81 mg by mouth 2 (two) times a week. On Monday and Wednesday   Yes [provider]  carvedilol (COREG) 3.125 MG tablet Take 3.125 mg by mouth 2 (two) times daily. 08/22/17  Yes [provider]  furosemide (LASIX) 20 MG tablet Take 1 tablet (20 mg total) by mouth daily. Patient taking differently: Take 10 mg by mouth daily.  09/18/17  Yes Fritzi Mandes, MD  hydrOXYzine (ATARAX/VISTARIL) 10 MG tablet Take 10 mg by mouth at bedtime. 09/04/17  Yes [provider]  magnesium oxide (MAG-OX) 400 (241.3 Mg) MG tablet Take 1 tablet (400 mg total) by mouth daily. 09/18/17  Yes Fritzi Mandes, MD  sacubitril-valsartan (ENTRESTO) 24-26 MG Take 1 tablet by mouth 2 (two) times daily. 09/18/17  Yes Fritzi Mandes, MD  triamcinolone ointment (KENALOG) 0.1 % Apply 1 application topically at bedtime. (avoid face, groin and armpits) 09/04/17  Yes [provider]   No Known Allergies Review of Systems  Constitutional: Positive for fatigue and unexpected weight change. Negative for appetite change.  Gastrointestinal: Negative for abdominal pain, nausea and vomiting.  Psychiatric/Behavioral: Negative for dysphoric mood and sleep disturbance. The patient is not  nervous/anxious.     Physical Exam  Constitutional: He is oriented to person, place, and time. He appears well-developed.  Cardiovascular: Normal rate.  Pulmonary/Chest: Effort normal.  Neurological: He is alert and oriented to person, place, and time.  Skin: Skin is warm.  Psychiatric: He has a normal mood and affect. His behavior is normal.  Nursing note and vitals reviewed.   Vital Signs: BP (!) 94/50 (BP Location: Left Arm)   Pulse (!) 56   Temp 98.2 F (36.8 C) (Oral)   Resp 18   Ht _0  (1.88 m)   Wt 59.2 kg (130 lb 8 oz)   SpO2 97%   BMI 16.76 kg/m  Pain Scale: 0-10   Pain Score: 0-No pain   SpO2: SpO2: 97 % O2 Device:SpO2: 97 % O2 Flow Rate: .   IO: Intake/output summary:   Intake/Output Summary (Last 24 hours) at 10/11/2017 1302 Last data filed at 10/11/2017 1024 Gross per 24 hour  Intake 120 ml  Output 550 ml  Net -430 ml    LBM: Last BM Date: 10/10/17 Baseline Weight: Weight: 61.7 kg (136 lb) Most recent weight: Weight: 59.2 kg (130 lb 8 oz)     Palliative Assessment/Data: PPS: 60%     Thank you for this consult. Palliative medicine will continue to follow and assist as needed.   Time In: 0930 Time Out: 1130 Time Total: 120 minutes Greater than 50%  of this time was spent counseling and coordinating care related to the above assessment and plan.  Signed by: Mariana Kaufman, AGNP-C Palliative Medicine    Please contact Palliative Medicine Team phone at [phone] for questions and concerns.  For individual provider: See Shea Evans

## 2017-10-11 NOTE — Care Management (Signed)
Potassium 6.1 this morning.  Over last 24 hours lowest systolic blood pressure is 67 and highest at 94. Palliative to meet with patient/family this morning

## 2017-10-11 NOTE — Care Management (Signed)
Remains observation.  Repeat potassium today is 6.1.  Received kayexelate and potassium will be repeated.  At present it is unknown why potassium is elevated.Referral has been made to [facility] to follow at home.  Patient is DNR.

## 2017-10-11 NOTE — Progress Notes (Signed)
Cardiovascular and Pulmonary Nurse Navigator Note:    84 year old male admitted with dx of hypotension with hx of chronic systolic CHF with LVEF 10%.  Patient has hx of ischemic CM, severe mitral regurgitation former tobacco abuse, and H27TN.  Note:  The patient was recently admitted on 09/17/2017 for acute on chronic CHF treated with IV diuretics.    Active problem list this admission: 1. Hypotension 2. ARF 3.Hyponatremia 4. Hyperkalemia 5. Elevated troponin due to above.   6. Chronic systolic CHF with EF 10% 7. Severe protein calorie malnutrition with dietitian following.   8. Failure to thrive - palliative care has been consulted.    Rounded on patient to review CHF education.  Patient sitting up on the side of bed watching TV when this RN entered the room.    Patient refused offer of Living Better with Heart Failure booklet, as patient stated he already has this booklet at home.  Patient stated, "I been knowing I have heart failure."  This RN acknowledged to patient that HF was not his primary reason for admission to the hospital.  Patient stated, "My blood pressure was low."   ? Reviewed the 5 steps to Living Better with Heart Failure.   1. Weight oneself daily and assess symptoms.  Patient has scales.   2. Know how you feel. *Reviewed with patient the following information: *Discussed when to call the Dr= weight gain of >2-3lb overnight of 5lb in a week,  *Discussed yellow zone= call MD: weight gain of >2-3lb overnight of 5lb in a week, increased swelling, increased SOB when lying down, chest discomfort, dizziness, increased fatigue *Red Zone= call 911: struggle to breath, fainting or near fainting, significant chest pain   3.Reviewed low sodium diet-provided handout of recommended and not recommended foods.Patient stated, "I do not add any salt and my wife does not add salt when cooking.  The food is pretty bland."  Discussed different seasonings one could try.    *Discussed fluid  intake with patient as well. Patient not currently on a fluid restriction, but advised no more than 8-8 ounces glass of fluids per day.? ? 4. Instructed patient to take medications as prescribed for heart failure. Explained briefly why pt is on the medications (either make you feel better, live longer or keep you out of the hospital) and discussed monitoring and side effects.  ? 5. Discussed exercise/acitivity.  Encouraged patient to remain as active as possible.    ? *Smoking Cessation- Patient is a former smoker.? ? *[facility] - [facility] on 10[facility]/2017.  Patient has follow-up appointment on 11/01/2017 at 11:20 a.m.    Again, the 5 Steps to Living Better with Heart Failure were reviewed with patient.  ? Patient thanked me for stopping in to see him.   ? Army Meliaiane Wright, RN, BSN, [facility]? [facility] &?Pulmonary Rehab  Cardiovascular & Pulmonary Nurse Navigator  Direct Line: [phone]  Department Phone #: [phone] Fax: [phone]? Email Address: [email or web address]

## 2017-10-11 NOTE — Progress Notes (Signed)
New referral for out patient Palliative to follow at home received from [facility] Nann Greene. Patient information faxed to referral. °Karen Robertson RN, BSN, CHPN °[facility], hospital Liaison °[phone] °

## 2017-10-12 ENCOUNTER — Encounter: Payer: Self-pay | Admitting: Internal Medicine

## 2017-10-12 LAB — BASIC METABOLIC PANEL
ANION GAP: 6 (ref 5–15)
BUN: 33 mg/dL — ABNORMAL HIGH (ref 8–23)
CALCIUM: 9 mg/dL (ref 8.9–10.3)
CO2: 25 mmol/L (ref 22–32)
CREATININE: 1.45 mg/dL — AB (ref 0.61–1.24)
Chloride: 104 mmol/L (ref 98–111)
GFR calc Af Amer: 48 mL/min — ABNORMAL LOW (ref >60)
GFR calc non Af Amer: 41 mL/min — ABNORMAL LOW (ref >60)
Glucose, Bld: 94 mg/dL (ref 70–99)
Potassium: 5.3 mmol/L — ABNORMAL HIGH (ref 3.5–5.1)
SODIUM: 135 mmol/L (ref 135–145)

## 2017-10-12 MED ORDER — PATIROMER SORBITEX CALCIUM 8.4 G PO PACK
8.4000 g | PACK | Freq: Every day | ORAL | Status: DC
  Administered 2017-10-12 – 2017-10-13 (×2): 8.4 g via ORAL
  Filled 2017-10-12 (×2): qty 1

## 2017-10-12 MED ORDER — MIDODRINE HCL 5 MG PO TABS: 5.0000 mg | ORAL_TABLET | Freq: Three times a day (TID) | ORAL | Status: DC

## 2017-10-12 NOTE — Progress Notes (Signed)
[facility] - [place] [facility]   PATIENT NAME: Erik LawsClyde Randall    MR#:  [id number]  DATE OF BIRTH:  March 13, 1927  SUBJECTIVE:   Feels well. No SOB/dizziness  REVIEW OF SYSTEMS:  CONSTITUTIONAL: No fever, fatigue or weakness.  EYES: No blurred or double vision.  EARS, NOSE, AND THROAT: No tinnitus or ear pain.  RESPIRATORY: No cough, shortness of breath, wheezing or hemoptysis.  CARDIOVASCULAR: No chest pain, orthopnea, edema.  GASTROINTESTINAL: No nausea, vomiting, diarrhea or abdominal pain.  GENITOURINARY: No dysuria, hematuria.  ENDOCRINE: No polyuria, nocturia,  HEMATOLOGY: No anemia, easy bruising or bleeding SKIN: No rash or lesion. MUSCULOSKELETAL: No joint pain or arthritis.   NEUROLOGIC: No tingling, numbness, weakness.  PSYCHIATRY: No anxiety or depression.   DRUG ALLERGIES:  No Known Allergies  VITALS:  Blood pressure (!) 74/47, pulse (!) 44, temperature (!) 97.5 F (36.4 C), temperature source Oral, resp. rate 18, height 6\' 2"  (1.88 m), weight 59.4 kg (131 lb), SpO2 99 %.  PHYSICAL EXAMINATION:  GENERAL:  84 y.o.-year-old patient lying in the bed with no acute distress.  EYES: Pupils equal, round, reactive to light and accommodation. No scleral icterus. Extraocular muscles intact.  HEENT: Head atraumatic, normocephalic. Oropharynx and nasopharynx clear.  NECK:  Supple, no jugular venous distention. No thyroid enlargement, no tenderness.  LUNGS: Normal breath sounds bilaterally, no wheezing, rales,rhonchi or crepitation. No use of accessory muscles of respiration.  CARDIOVASCULAR: S1, S2 normal. No murmurs, rubs, or gallops.  ABDOMEN: Soft, nontender, nondistended. Bowel sounds present. No organomegaly or mass.  EXTREMITIES: No pedal edema, cyanosis, or clubbing.  NEUROLOGIC: Cranial nerves II through XII are intact. Muscle strength 5/5 in all extremities. Sensation intact. Gait not checked.  PSYCHIATRIC: The patient is alert and  oriented x 3.  SKIN: No obvious rash, lesion, or ulcer.    LABORATORY PANEL:   CBC Recent Labs  Lab 10/11/17 0421  WBC 3.4*  HGB 12.1*  HCT 35.8*  PLT 168   ------------------------------------------------------------------------------------------------------------------  Chemistries  Recent Labs  Lab 10/09/17 1038  10/10/17 0917  10/12/17 0520  NA 133*   < > 135   < > 135  K 5.4*   < > 5.2*   < > 5.3*  CL 101   < > 106   < > 104  CO2 24   < > 24   < > 25  GLUCOSE 87   < > 80   < > 94  BUN 37*   < > 33*   < > 33*  CREATININE 1.45*   < > 1.42*   < > 1.45*  CALCIUM 9.6   < > 9.2   < > 9.0  MG  --   --  2.2  --   --   AST 26  --   --   --   --   ALT 18  --   --   --   --   ALKPHOS 85  --   --   --   --   BILITOT 1.2  --   --   --   --    < > = values in this interval not displayed.   ------------------------------------------------------------------------------------------------------------------  Cardiac Enzymes Recent Labs  Lab 10/09/17 2243  TROPONINI 0.06*   ------------------------------------------------------------------------------------------------------------------  RADIOLOGY:  No results found.  EKG:   Orders placed or performed during the hospital encounter of 10/09/17  . EKG 12-Lead  . EKG 12-Lead  . EKG 12-Lead  .  EKG 12-Lead  . EKG 12-Lead  . EKG 12-Lead    ASSESSMENT AND PLAN:   * Hypotension due to cardiomyopathy BB and entresto stopped Monitor asymptomatic  * AKI due to hypotension has resolved  * Mild chronic hyponatremia  * Hyperkalemia- Add Veltessa daily Advised to stop bananas  * Elevated troponin due to above Stable  * Chronic systolic CHF with ejection fraction 10%.   Cardiology input noted  * Severe protein calorie malnutrition-dietitian Supplements  All the records are reviewed and case discussed with Care Management/Social Workerr. Management plans discussed with the patient, wife Corrie Dandy at bedside and  they are in agreement.  CODE STATUS: DNR/DNI  TOTAL TIME TAKING CARE OF THIS PATIENT: 35 minutes.   POSSIBLE D/C IN 1 DAYS, DEPENDING ON CLINICAL CONDITION.  Note: This dictation was prepared with Dragon dictation along with smaller phrase technology. Any transcriptional errors that result from this process are unintentional.   Orie Fisherman M.D on 10/12/2017 at 2:02 PM  Between 7am to 6pm - Pager - [phone]  After 6pm go to [email or web address] - password [email or web address] [facility]  [place]  [facility]  Office  [phone]  CC: Primary care physician; Dorothey Baseman, MD

## 2017-10-12 NOTE — Progress Notes (Signed)
Dr. Elpidio AnisSudini notified about patient's blood pressures. No new orders, continue to monitor.

## 2017-10-13 LAB — BASIC METABOLIC PANEL
Anion gap: 6 (ref 5–15)
BUN: 32 mg/dL — ABNORMAL HIGH (ref 8–23)
CO2: 27 mmol/L (ref 22–32)
Calcium: 9.1 mg/dL (ref 8.9–10.3)
Chloride: 102 mmol/L (ref 98–111)
Creatinine, Ser: 1.21 mg/dL (ref 0.61–1.24)
GFR calc non Af Amer: 52 mL/min — ABNORMAL LOW (ref >60)
GFR, EST AFRICAN AMERICAN: 60 mL/min — AB (ref >60)
GLUCOSE: 94 mg/dL (ref 70–99)
Potassium: 5 mmol/L (ref 3.5–5.1)
Sodium: 135 mmol/L (ref 135–145)

## 2017-10-13 LAB — MAGNESIUM: Magnesium: 2 mg/dL (ref 1.7–2.4)

## 2017-10-13 MED ORDER — FUROSEMIDE 20 MG PO TABS
20.0000 mg | ORAL_TABLET | Freq: Every day | ORAL | Status: DC
  Filled 2017-10-13: qty 1

## 2017-10-13 MED ORDER — PATIROMER SORBITEX CALCIUM 8.4 G PO PACK: 8.4000 g | PACK | Freq: Every day | ORAL | 0 refills | Status: AC

## 2017-10-13 MED ORDER — FUROSEMIDE 20 MG PO TABS: 10.0000 mg | ORAL_TABLET | Freq: Every day | ORAL | Status: AC | PRN

## 2017-10-13 NOTE — Progress Notes (Addendum)
Patient due for Po lasix 20 mg however bp is 84/70 pulse 63,pt asymptomatic.  Dr. Elpidio AnisSudini notified per MD will d/c lasix. Will continue to monitor.

## 2017-10-13 NOTE — Progress Notes (Signed)
CCMD reported a 5 beat run of wide QRS around 0030 and then later a 9 beat run of V-tach. MD Katina DungPrashanna made aware. Magnesium ordered to a.m. Labs. No concerns offered from patient

## 2017-10-13 NOTE — Progress Notes (Signed)
Claudette Lawslyde Ben to be D/C'd Home per MD order.  Discussed prescriptions and follow up appointments with the patient. Prescriptions were e-prescribed, medication list explained in detail. Pt verbalized understanding.Wife at bedside.  Allergies as of 10/13/2017   No Known Allergies     Medication List    STOP taking these medications   carvedilol 3.125 MG tablet Commonly known as:  COREG   sacubitril-valsartan 24-26 MG Commonly known as:  ENTRESTO     TAKE these medications   aspirin EC 81 MG tablet Take 81 mg by mouth 2 (two) times a week. On Monday and Wednesday   furosemide 20 MG tablet Commonly known as:  LASIX Take 0.5 tablets (10 mg total) by mouth daily as needed for fluid. What changed:    how much to take  when to take this  reasons to take this   hydrOXYzine 10 MG tablet Commonly known as:  ATARAX/VISTARIL Take 10 mg by mouth at bedtime.   magnesium oxide 400 (241.3 Mg) MG tablet Commonly known as:  MAG-OX Take 1 tablet (400 mg total) by mouth daily.   patiromer 8.4 g packet Commonly known as:  VELTASSA Take 1 packet (8.4 g total) by mouth daily. Start taking on:  10/14/2017   triamcinolone ointment 0.1 % Commonly known as:  KENALOG Apply 1 application topically at bedtime. (avoid face, groin and armpits)       Vitals:   10/13/17 0434 10/13/17 0805  BP: 106/78 (!) 84/70  Pulse: (!) 59 63  Resp:  18  Temp: 97.8 F (36.6 C) 98.3 F (36.8 C)  SpO2: 100% 98%    Tele box removed and returned. Skin clean, dry and intact without evidence of skin break down, no evidence of skin tears noted. IV catheter discontinued intact. Site without signs and symptoms of complications. Dressing and pressure applied. Pt denies pain at this time. No complaints noted.  An After Visit Summary was printed and given to the patient. Patient escorted via WC, and D/C home via private auto.  Rigoberto NoelErica Y Ocie Tino

## 2017-10-13 NOTE — Care Management (Addendum)
Discharge to home today per Dr. Elpidio AnisSudini. Mellissa Kohuteresa Cooper, [facility] representative updated.  Family will transport. Discharge cancelled due to be orthostatic. Mellissa Kohuteresa Cooper, [facility] representative updated Gwenette GreetBrenda S Novalee Horsfall RN MSN CCM Care Management [phone]

## 2017-10-13 NOTE — Discharge Instructions (Signed)
°-   Daily fluids < 2 liters. - Low salt diet - Check weight everyday and keep log. Take to your doctors appt. - Take extra dose of lasix if you gain more than 3 pounds weight.  You will need blood work to check Potassium levels in 4-5 days when you see your doctor

## 2017-10-13 NOTE — Care Management Important Message (Signed)
Copy of signed IM left with patient in room.  

## 2017-10-14 LAB — CULTURE, BLOOD (ROUTINE X 2)
CULTURE: NO GROWTH
Culture: NO GROWTH
Special Requests: ADEQUATE
Special Requests: ADEQUATE

## 2017-10-17 NOTE — Discharge Summary (Signed)
[facility] - [place] [facility]   PATIENT NAME: Erik Randall    MR#:  [id number]  DATE OF BIRTH:  [birth year]  DATE OF ADMISSION:  10/09/2017 ADMITTING PHYSICIAN: Shaune Pollack, MD  DATE OF DISCHARGE: 10/13/2017 11:45 AM  PRIMARY CARE PHYSICIAN: Dorothey Baseman, MD   ADMISSION DIAGNOSIS:  Weakness [R53.1] Hypotension, unspecified hypotension type [I95.9] Chronic congestive heart failure, unspecified heart failure type (HCC) [I50.9]  DISCHARGE DIAGNOSIS:  Active Problems:   Hypotension   Protein-calorie malnutrition, severe   Advance care planning   Palliative care by specialist   Goals of care, counseling/discussion   SECONDARY DIAGNOSIS:   Past Medical History:  Diagnosis Date  . CHF (congestive heart failure) (HCC)   . Hypertension      ADMITTING HISTORY  HISTORY OF PRESENT ILLNESS:  Erik Randall  is a 84 y.o. male with a known history of hypertension and chronic systolic CHF with ejection fraction 10%.  He was just recently admitted for fluid overload and treated with diuretic.  He complains of generalized weakness and dizziness.  His wife checked his blood pressure, and unable to get a reading.  So he was sent to ED for further evaluation blood pressure was low at 70s.  He is treated with normal saline IV in the ED.  The patient denies any other symptoms.     HOSPITAL COURSE:    * Hypotension due to cardiomyopathy BB and entresto stopped  treated with IV fluids initially   Cautious not to fluid overload.  Ejection fraction 10%.   Patient continues to have hypotension but is completely asymptomatic.   Discussed with cardiology Dr. Adrienne Mocha  * AKI due to hypotension has resolved  * Mild chronic hyponatremia  * Hyperkalemia- Added Veltessa daily Advised to stop bananas   Improved  needs repeat potassium in 4-5 days with primary care physician.  Discussed with patient and wife in detail.  * Elevated troponin due to  above Stable  * Chronic systolic CHF with ejection fraction 10%.  Cardiology input noted  * Severe protein calorie malnutrition-dietitian Supplements    Patient discharged home in stable condition with low-dose Lasix.   CONSULTS OBTAINED:  Treatment Team:  Marcina Millard, MD  DRUG ALLERGIES:  No Known Allergies  DISCHARGE MEDICATIONS:   Allergies as of 10/13/2017   No Known Allergies     Medication List    STOP taking these medications   carvedilol 3.125 MG tablet Commonly known as:  COREG   sacubitril-valsartan 24-26 MG Commonly known as:  ENTRESTO     TAKE these medications   aspirin EC 81 MG tablet Take 81 mg by mouth 2 (two) times a week. On Monday and Wednesday   furosemide 20 MG tablet Commonly known as:  LASIX Take 0.5 tablets (10 mg total) by mouth daily as needed for fluid. What changed:    how much to take  when to take this  reasons to take this   hydrOXYzine 10 MG tablet Commonly known as:  ATARAX/VISTARIL Take 10 mg by mouth at bedtime.   magnesium oxide 400 (241.3 Mg) MG tablet Commonly known as:  MAG-OX Take 1 tablet (400 mg total) by mouth daily.   patiromer 8.4 g packet Commonly known as:  VELTASSA Take 1 packet (8.4 g total) by mouth daily.   triamcinolone ointment 0.1 % Commonly known as:  KENALOG Apply 1 application topically at bedtime. (avoid face, groin and armpits)       Today   VITAL SIGNS:  Blood pressure (!) 84/70, pulse 63, temperature 98.3 F (36.8 C), temperature source Oral, resp. rate 18, height 6\' 2"  (1.88 m), weight 59.3 kg (130 lb 11.2 oz), SpO2 98 %.  I/O:  No intake or output data in the 24 hours ending 10/17/17 1528  PHYSICAL EXAMINATION:  Physical Exam  GENERAL:  84 y.o.-year-old patient lying in the bed with no acute distress.  LUNGS: Normal breath sounds bilaterally, no wheezing, rales,rhonchi or crepitation. No use of accessory muscles of respiration.  CARDIOVASCULAR: S1, S2 normal. No  murmurs, rubs, or gallops.  ABDOMEN: Soft, non-tender, non-distended. Bowel sounds present. No organomegaly or mass.  NEUROLOGIC: Moves all 4 extremities. PSYCHIATRIC: The patient is alert and oriented x 3.  SKIN: No obvious rash, lesion, or ulcer.   DATA REVIEW:   CBC Recent Labs  Lab 10/11/17 0421  WBC 3.4*  HGB 12.1*  HCT 35.8*  PLT 168    Chemistries  Recent Labs  Lab 10/13/17 0545  NA 135  K 5.0  CL 102  CO2 27  GLUCOSE 94  BUN 32*  CREATININE 1.21  CALCIUM 9.1  MG 2.0    Cardiac Enzymes No results for input(s): TROPONINI in the last 168 hours.  Microbiology Results  Results for orders placed or performed during the hospital encounter of 10/09/17  Blood Culture (routine x 2)     Status: None   Collection Time: 10/09/17 10:39 AM  Result Value Ref Range Status   Specimen Description BLOOD RIGHT [facility]  Final   Special Requests   Final    BOTTLES DRAWN AEROBIC AND ANAEROBIC Blood Culture adequate volume   Culture   Final    NO GROWTH 5 DAYS Performed at [facility], [address], [place], [place] [address]    Report Status 10/14/2017 FINAL  Final  Blood Culture (routine x 2)     Status: None   Collection Time: 10/09/17 10:39 AM  Result Value Ref Range Status   Specimen Description BLOOD LEFT ARM  Final   Special Requests   Final    BOTTLES DRAWN AEROBIC AND ANAEROBIC Blood Culture adequate volume   Culture   Final    NO GROWTH 5 DAYS Performed at [facility], [address] [place], [place] [address]    Report Status 10/14/2017 FINAL  Final    RADIOLOGY:  No results found.  Follow up with PCP in 1 week.  Management plans discussed with the patient, family and they are in agreement.  CODE STATUS:  Code Status History    Date Active Date Inactive Code Status Order ID Comments User Context   10/11/2017 1105 10/13/2017 1518 DNR [id number]  Barbara Cower, NP Inpatient   10/09/2017 1323 10/11/2017 1105 Full Code [id number]  Shaune Pollack, MD ED   09/17/2017 1129 09/18/2017 1800 Full Code [id number]  Enedina Finner, MD Inpatient    Questions for Most Recent Historical Code Status (Order [id number])    Question Answer Comment   In the event of cardiac or respiratory ARREST Do not call a "code blue"    In the event of cardiac or respiratory ARREST Do not perform Intubation, CPR, defibrillation or ACLS    In the event of cardiac or respiratory ARREST Use medication by any route, position, wound care, and other measures to relive pain and suffering. May use oxygen, suction and manual treatment of airway obstruction as needed for comfort.       TOTAL TIME TAKING CARE OF THIS PATIENT ON  DAY OF DISCHARGE: more than 30 minutes.   Molinda BailiffSrikar R Eural Holzschuh M.D on 10/17/2017 at 3:28 PM  Between 7am to 6pm - Pager - [phone]  After 6pm go to [email or web address] - password [email or web address]  [facility]  Office  [phone]  CC: Primary care physician; Dorothey BasemanBronstein, David, MD  Note: This dictation was prepared with Dragon dictation along with smaller phrase technology. Any transcriptional errors that result from this process are unintentional.

## 2017-10-18 ENCOUNTER — Encounter: Payer: Self-pay | Admitting: Family

## 2017-10-18 ENCOUNTER — Ambulatory Visit: Payer: Medicare Other | Attending: Family | Admitting: Family

## 2017-10-18 VITALS — BP 89/65 | HR 64 | Resp 18 | Ht 74.0 in | Wt 136.1 lb

## 2017-10-18 DIAGNOSIS — I5022 Chronic systolic (congestive) heart failure: Secondary | ICD-10-CM | POA: Diagnosis present

## 2017-10-18 DIAGNOSIS — Z7982 Long term (current) use of aspirin: Secondary | ICD-10-CM | POA: Insufficient documentation

## 2017-10-18 DIAGNOSIS — R42 Dizziness and giddiness: Secondary | ICD-10-CM | POA: Insufficient documentation

## 2017-10-18 DIAGNOSIS — I110 Hypertensive heart disease with heart failure: Secondary | ICD-10-CM | POA: Insufficient documentation

## 2017-10-18 DIAGNOSIS — Z79899 Other long term (current) drug therapy: Secondary | ICD-10-CM | POA: Diagnosis not present

## 2017-10-18 DIAGNOSIS — Z720 Tobacco use: Secondary | ICD-10-CM | POA: Insufficient documentation

## 2017-10-18 DIAGNOSIS — Z8249 Family history of ischemic heart disease and other diseases of the circulatory system: Secondary | ICD-10-CM | POA: Insufficient documentation

## 2017-10-18 DIAGNOSIS — I950 Idiopathic hypotension: Secondary | ICD-10-CM

## 2017-10-18 DIAGNOSIS — I959 Hypotension, unspecified: Secondary | ICD-10-CM | POA: Diagnosis not present

## 2017-10-18 MED ORDER — MAGNESIUM OXIDE 400 (241.3 MG) MG PO TABS: 400.0000 mg | ORAL_TABLET | Freq: Every day | ORAL | 6 refills | Status: AC

## 2017-10-18 NOTE — Progress Notes (Signed)
Patient ID: Erik LawsClyde Randall, male    DOB: 12-18-1926, 84 y.o.   MRN: [id number]  HPI  Mr Erik Randall is a 84 y/o male with a history of HTN, previous tobacco use and chronic heart failure.   Echo report from 09/17/17 reviewed and showed an EF of 10% along with mild AR, severe MR and elevated PA pressure of 53 mm Hg.   Admitted 10/09/17 due to hypotension. Cardiology consult obtained and medications were held. Discharged after 4 days. Admitted 09/17/17 due to acute on chronic HF. Initially needed IV lasix and then transitioned to oral diuretics. Cardiology consult obtained. Discharged the following day.   He presents today for a follow-up visit with a chief complaint of moderate fatigue upon minimal exertion. He describes this as chronic in nature having been present for several years. He has associated cough and dizziness along with this. He denies any difficulty sleeping, abdominal distention, palpitations, pedal edema, chest pain, shortness of breath, wheezing or weight gain. He had inadvertently continued to take the carvedilol that was supposed to be held at hospital discharge.   Past Medical History:  Diagnosis Date  . CHF (congestive heart failure) (HCC)   . Hypertension    No past surgical history on file. Family History  Problem Relation Age of Onset  . Stroke Mother   . Hypertension Father   . Heart attack Father   . Hypertension Sister    Social History   Tobacco Use  . Smoking status: Former Games developermoker  . Smokeless tobacco: Never Used  Substance Use Topics  . Alcohol use: Never    Frequency: Never   No Known Allergies  Prior to Admission medications   Medication Sig Start Date End Date Taking? Authorizing Provider  aspirin EC 81 MG tablet Take 81 mg by mouth 2 (two) times a week. On Monday and Wednesday   Yes [provider]  furosemide (LASIX) 20 MG tablet Take 0.5 tablets (10 mg total) by mouth daily as needed for fluid. 10/13/17  Yes Sudini, Wardell HeathSrikar, MD  magnesium oxide  (MAG-OX) 400 (241.3 Mg) MG tablet Take 1 tablet (400 mg total) by mouth daily. 10/18/17  Yes Brityn Mastrogiovanni, Jarold Songina A, FNP  patiromer (VELTASSA) 8.4 g packet Take 1 packet (8.4 g total) by mouth daily. 10/14/17  Yes Milagros LollSudini, Srikar, MD  triamcinolone ointment (KENALOG) 0.1 % Apply 1 application topically at bedtime. (avoid face, groin and armpits) 09/04/17  Yes [provider]    Review of Systems  Constitutional: Positive for appetite change (decreased) and fatigue.  HENT: Positive for rhinorrhea. Negative for congestion and sore throat.   Eyes: Negative.   Respiratory: Positive for cough (productive white sputum). Negative for chest tightness, shortness of breath and wheezing.   Cardiovascular: Negative for chest pain, palpitations and leg swelling.  Gastrointestinal: Negative for abdominal distention and abdominal pain.  Endocrine: Negative.   Genitourinary: Negative.   Musculoskeletal: Negative.  Negative for back pain and neck pain.  Skin: Negative.   Allergic/Immunologic: Negative.   Neurological: Positive for dizziness (when taking carvedilol). Negative for light-headedness.  Hematological: Negative for adenopathy. Does not bruise/bleed easily.  Psychiatric/Behavioral: Negative for dysphoric mood and sleep disturbance (sleeping on 3 pillows). The patient is not nervous/anxious.    Vitals:   10/18/17 0946  BP: (!) 89/65  Pulse: 64  Resp: 18  SpO2: 99%  Weight: 136 lb 2 oz (61.7 kg)  Height: 6\' 2"  (1.88 m)   Wt Readings from Last 3 Encounters:  10/18/17 136 lb 2  oz (61.7 kg)  10/13/17 130 lb 11.2 oz (59.3 kg)  10/06/17 136 lb 8 oz (61.9 kg)   Lab Results  Component Value Date   CREATININE 1.21 10/13/2017   CREATININE 1.45 (H) 10/12/2017   CREATININE 1.34 (H) 10/11/2017    Physical Exam  Constitutional: He is oriented to person, place, and time. He appears well-developed and well-nourished.  HENT:  Head: Normocephalic and atraumatic.  Neck: Normal range of motion. Neck  supple. No JVD present.  Cardiovascular: Normal rate and regular rhythm.  Pulmonary/Chest: Effort normal and breath sounds normal. He has no wheezes. He has no rales.  Abdominal: Soft. He exhibits no distension.  Musculoskeletal: He exhibits no edema or tenderness.  Neurological: He is alert and oriented to person, place, and time.  Skin: Skin is warm and dry.  Psychiatric: He has a normal mood and affect. His behavior is normal. Thought content normal.  Nursing note and vitals reviewed.   Assessment & Plan:   1: Chronic heart failure with reduced ejection fraction- - NYHA class III - euvolemic today - weighing daily; reminded to call for an overnight weight gain of >2 pounds or a weekly weight gain of >5 pounds - weight unchanged from last time here  - not adding salt and is trying to read food labels for sodium content. Reviewed the importance of closely following a 2000mg  sodium diet  - says that he's drinking more water along with apple juice but was told while in the hospital to keep daily fluid intake to closer to 40 ounces daily - had continued to take carvedilol and he was instructed to stop taking it due to his BP - he is to take lasix only as needed for weight gain or edema  - saw cardiology Gwen Pounds) 08/25/17 - BNP 10/09/17 was 386.0 - PharmD reconciled medications with the patient  2: Hypotension- - BP remains low - BP low today & patient experiencing dizziness - BMP 10/13/17 reviewed and showed sodium 135, potassium 5.0 and GFR 60  3: Tobacco use- - patient quit smoking ~ 1 month ago - continued cessation discussed for 3 minutes with him  Medication bottles were reviewed.  Return in 6 weeks or sooner for any questions/problems before then.

## 2017-10-18 NOTE — Patient Instructions (Signed)
Continue weighing daily and call for an overnight weight gain of > 2 pounds or a weekly weight gain of >5 pounds. 

## 2017-11-01 ENCOUNTER — Ambulatory Visit: Payer: Medicare Other | Admitting: Family

## 2017-11-12 DEATH — deceased

## 2017-11-29 ENCOUNTER — Ambulatory Visit: Payer: Medicare Other | Admitting: Family

## 2019-01-07 IMAGING — DX DG CHEST 1V PORT
1 series · 1 of 1 positions shown · non-contrast
Comparison: Radiographs September 17, 2017.

CLINICAL DATA: Hypertension.

EXAM:
PORTABLE CHEST 1 VIEW

[chest ap]
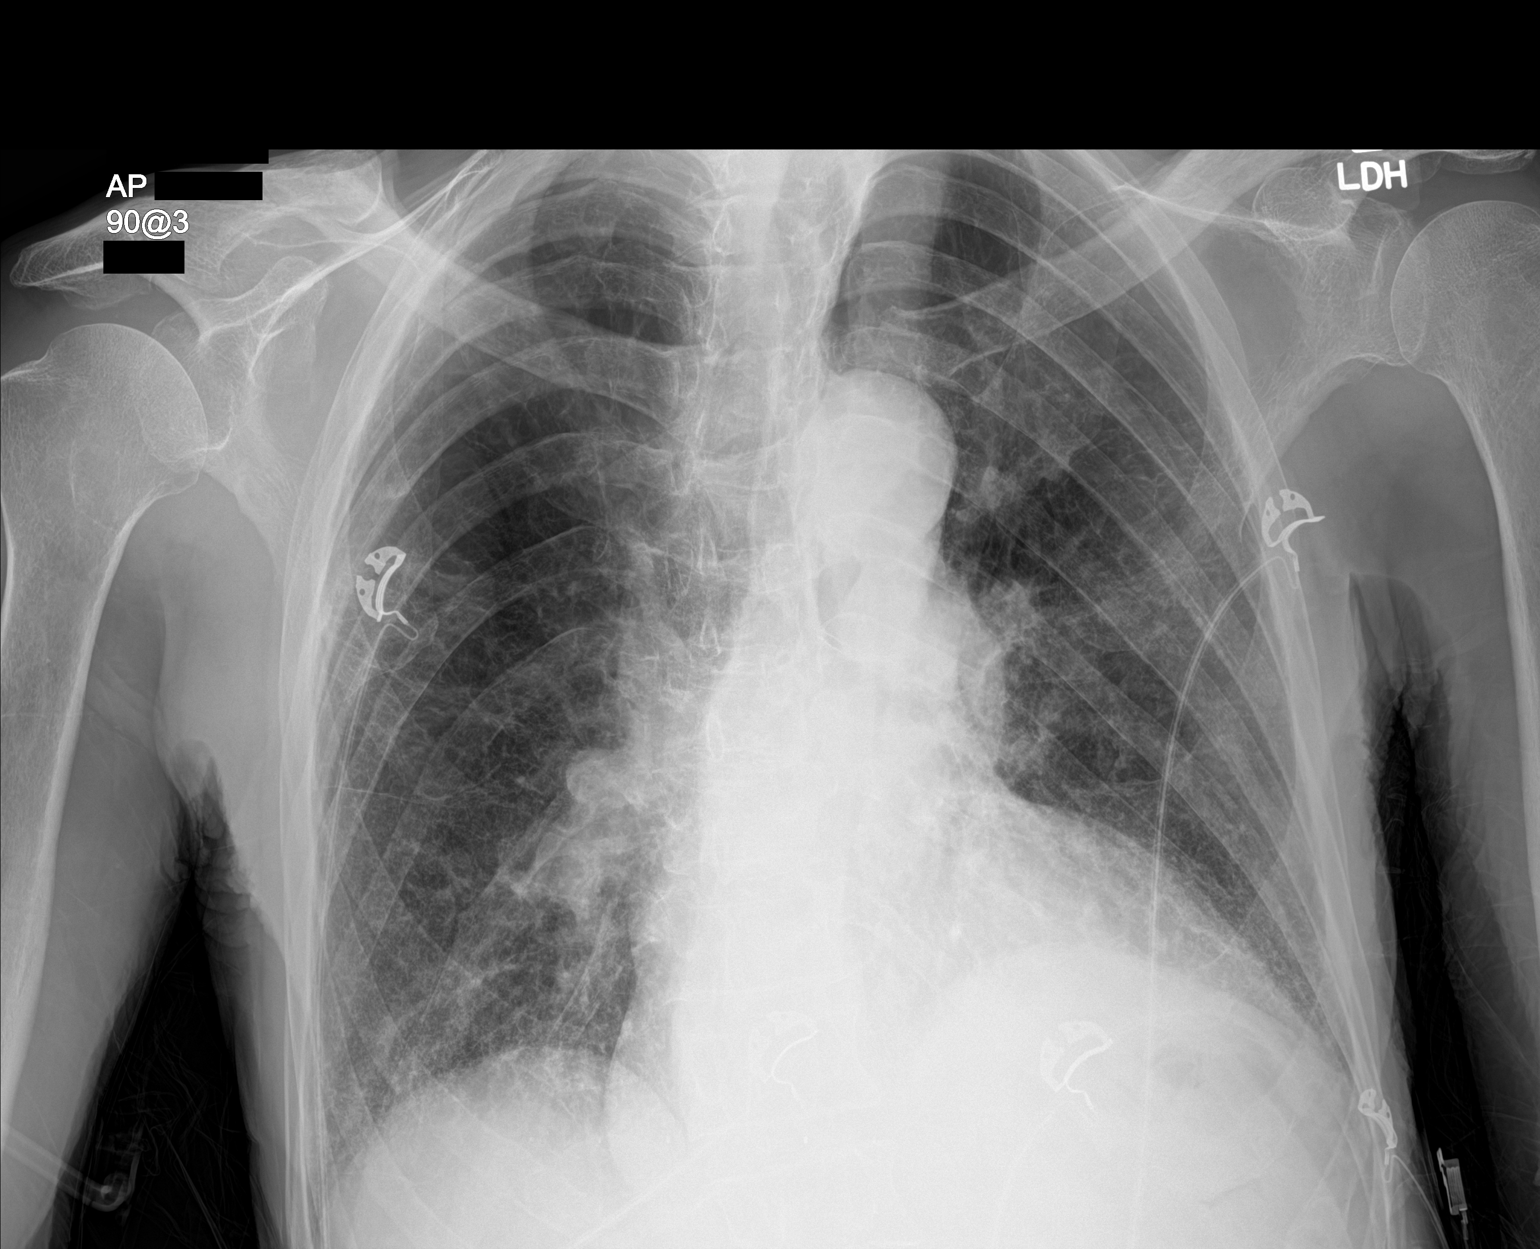

[1 of 1 positions shown; findings below may reference images not displayed]

FINDINGS: Stable cardiomediastinal silhouette. No pneumothorax is noted.
Emphysematous disease is noted in both upper lobes. Stable bibasilar
subsegmental atelectasis or scarring is noted. Bony thorax is
unremarkable.
IMPRESSION: Stable bibasilar subsegmental atelectasis or scarring.

Emphysema ([id number]).
